# Patient Record
Sex: Male | Born: 1960
Health system: Southern US, Community
[De-identification: ages and names within clinical notes are randomized; demographics above are authoritative.]

## PROBLEM LIST (undated history)

## (undated) ENCOUNTER — Ambulatory Visit: Admission: EM | Payer: Self-pay | Source: Home / Self Care

## (undated) DIAGNOSIS — J40 Bronchitis, not specified as acute or chronic: Secondary | ICD-10-CM

## (undated) HISTORY — PX: HERNIA REPAIR: SHX51

---

## 2006-05-17 ENCOUNTER — Ambulatory Visit (HOSPITAL_COMMUNITY): Admission: RE | Admit: 2006-05-17 | Discharge: 2006-05-17 | Payer: Self-pay | Admitting: Surgery

## 2010-07-06 ENCOUNTER — Emergency Department (HOSPITAL_COMMUNITY)
Admission: EM | Admit: 2010-07-06 | Discharge: 2010-07-06 | Payer: Self-pay | Source: Home / Self Care | Admitting: Family Medicine

## 2010-12-16 NOTE — Op Note (Signed)
NAME:  XAIDEN, FLEIG NO.:  [id number]   MEDICAL RECORD NO.:  [id number]          PATIENT TYPE:  AMB   LOCATION:  DAY                          FACILITY:  [facility]   PHYSICIAN:  Wilmon Arms. Corliss Skains, M.D. DATE OF BIRTH:  [birth year]   DATE OF PROCEDURE:  05/17/2006  DATE OF DISCHARGE:                                 OPERATIVE REPORT   PREOPERATIVE DIAGNOSIS:  Bilateral inguinal hernias.   POSTOPERATIVE DIAGNOSIS:  Bilateral inguinal hernias.   PROCEDURE PERFORMED:  Laparoscopic preperitoneal bilateral inguinal hernia  repair with mesh.   SURGEON:  Wilmon Arms. Tsuei, M.D.   ANESTHESIA:  General endotracheal.   INDICATIONS:  The patient is a 50 year old male who presented with a year of  a left inguinal hernia.  This hernia has become larger and causes discomfort  when he is lifting or with prolonged sitting.  The hernia has been  reducible.  On examination in the office, the patient was also noted have a  very small right inguinal hernia.  The decision was made to repair both  laparoscopically.   DESCRIPTION OF PROCEDURE:  The patient was brought to the operating room,  placed in supine position on the operating room table.  Preoperative  antibiotics were given.  Compression hose were placed on his legs.  After an  adequate level of general anesthesia was obtained, a Foley catheter was  placed under sterile technique.  The patient's abdomen was shaved, prepped  with Betadine and draped in sterile fashion.  A time-out was taken assure  proper patient and proper procedure.  After infiltrating with 0.25%  Marcaine, a 1 cm transverse incision was made below the level of the  umbilicus just to the right of midline.  Dissection was carried down to the  anterior rectus sheath which was opened transversely.  The rectus muscle was  retracted laterally and a Kelly clamp was used to open the retromuscular  space.  The dissection balloon was then inserted into this  retromastoid  space and advanced down to the symphysis pubis.  The inner trocar was  removed and the laparoscope inserted into the balloon trocar.  The balloon  trocar was then inflated with good bilateral inflation.  It was held in  place for 5 minutes for good hemostasis.  The balloon was then deflated and  removed.  The working trocar was then inserted into the preperitoneal space  and the inner balloon was inflated.  Pneumoperitoneum was then obtained by  insufflating CO2 maintaining maximal pressure of 15 mmHg.  The laparoscope  was inserted.  The preperitoneal space was examined.  There was good  dissection with the balloon.  The inferior epigastric vessels were seen  anteriorly on the posterior surface of the rectus muscles.  Two 5 mL ports  were placed in the lower midline.  We began working on the patient's left  sided hernia as this was larger.  A fairly large indirect hernia was reduced  with blunt dissection.  The preperitoneal space was opened all the way out  to the anterior superior iliac spine.  We circumferentially dissected around  the spermatic cord.  No direct defect was noted.  The hernia sac was then  completely reduced.  The Cooper's ligament and pubic bones were also  dissected so they were clearly visualized.  Once our dissection was complete  on the left side, we moved to the other side of the table.  We then worked  on the patient's right side.  This preperitoneal space was opened widely.  A  fairly small indirect hernia sac was reduced off of the spermatic cord.  We  circumferentially dissected around the spermatic cord.  No direct defect was  noted.  We then took a 6 inch x 6 inch piece of ULTRAPRO mesh and cut it in  half.  A 3 inch x 6 inch piece was placed in the preperitoneal space on the  patient's right.  This was opened and secured beginning at the pubic  symphysis with a ProTack device.  A total of 4 tacks were placed anteriorly  to secure the mesh.   This covered the direct and indirect spaces.  The  bottom edge of the mesh was tucked underneath the edge of the peritoneum.  We then moved back to the other side of the table.  The other 3 inch x 6  inch piece of ULTRAPRO mesh was opened in the left preperitoneal space.  This was also secured with 4 ProTacks in similar fashion.  The mesh sat very  easily without any tension.  Pneumoperitoneum was then slowly released and  the peritoneal contents came to rest on the mesh.  Trocars were removed.  The fascia of the anterior rectus sheath was closed with 0 Vicryl.  The 4-0  Monocryl was used to close the skin.  Steri-Strips and clean dressings were  applied.  The Foley catheter was removed.  The patient was extubated and  brought to recovery stable condition.  All sponge, instrument and needle  counts were correct.      Wilmon Arms. Tsuei, M.D.  Electronically Signed     MKT/MEDQ  D:  05/17/2006  T:  05/18/2006  Job:  [id number]

## 2013-02-06 ENCOUNTER — Emergency Department (HOSPITAL_COMMUNITY)
Admission: EM | Admit: 2013-02-06 | Discharge: 2013-02-06 | Disposition: A | Discharge status: Home / Self Care | Payer: 59 | Attending: Emergency Medicine | Admitting: Emergency Medicine

## 2013-02-06 ENCOUNTER — Encounter (HOSPITAL_COMMUNITY): Payer: Self-pay

## 2013-02-06 DIAGNOSIS — Y939 Activity, unspecified: Secondary | ICD-10-CM | POA: Insufficient documentation

## 2013-02-06 DIAGNOSIS — T161XXA Foreign body in right ear, initial encounter: Secondary | ICD-10-CM

## 2013-02-06 DIAGNOSIS — Y929 Unspecified place or not applicable: Secondary | ICD-10-CM | POA: Insufficient documentation

## 2013-02-06 DIAGNOSIS — T169XXA Foreign body in ear, unspecified ear, initial encounter: Secondary | ICD-10-CM | POA: Insufficient documentation

## 2013-02-06 DIAGNOSIS — IMO0002 Reserved for concepts with insufficient information to code with codable children: Secondary | ICD-10-CM | POA: Insufficient documentation

## 2013-02-06 NOTE — ED Notes (Signed)
Pt thinks he has a bug in his ear

## 2013-02-06 NOTE — ED Provider Notes (Signed)
   History    This chart was scribed for non-physician practitioner Roxy Horseman PA-C, working with Nelia Shi, MD by Donne Anon, ED Scribe. This patient was seen in room WTR4/WLPT4 and the patient's care was started at 2321.  CSN: [id number] Arrival date & time 02/06/13  2307  First MD Initiated Contact with Patient 02/06/13 2321     Chief Complaint  Patient presents with  . Foreign Body in Ear    The history is provided by the patient and the EMS personnel. No language interpreter was used.   HPI Comments: Walter Adams is a 52 y.o. male brought in by ambulance, who presents to the Emergency Department complaining of an insect in his right ear. He states it flew into his ear and he could hear it buzzing. He reports it feels better now that the nurse removed the insect. He denies any other pain at this time.   History reviewed. No pertinent past medical history. History reviewed. No pertinent past surgical history. History reviewed. No pertinent family history.  History  Substance Use Topics  . Smoking status: Not on file  . Smokeless tobacco: Not on file  . Alcohol Use: No    Review of Systems A complete 10 system review of systems was obtained and all systems are negative except as noted in the HPI and PMH.   Allergies  Review of patient's allergies indicates no known allergies.  Home Medications  No current outpatient prescriptions on file.  BP 124/76  Pulse 101  Temp(Src) 98.1 F (36.7 C) (Oral)  Resp 20  SpO2 100%  Physical Exam  Nursing note and vitals reviewed. Constitutional: He appears well-developed and well-nourished. No distress.  HENT:  Head: Normocephalic and atraumatic.  Right Ear: Tympanic membrane and ear canal normal.  Left Ear: Tympanic membrane and ear canal normal.  No signs of infection.   Eyes: Conjunctivae are normal.  Neck: Neck supple. No tracheal deviation present.  Cardiovascular: Normal rate.   Pulmonary/Chest: Effort  normal. No respiratory distress.  Musculoskeletal: Normal range of motion.  Neurological: He is alert.  Skin: Skin is warm and dry.  Psychiatric: He has a normal mood and affect. His behavior is normal.    ED Course  Procedures (including critical care time) DIAGNOSTIC STUDIES: Oxygen Saturation is 100% on RA, normal by my interpretation.    COORDINATION OF CARE: 11:27 PM Discussed treatment plan with pt at bedside and pt agreed to plan.    Labs Reviewed - No data to display No results found. 1. Foreign body in ear, right, initial encounter     MDM  Patient with bug in the ear. Insect was removed by a triage nurse successfully. Patient is stable and ready for discharge.  I personally performed the services described in this documentation, which was scribed in my presence. The recorded information has been reviewed and is accurate.    Roxy Horseman, PA-C 02/07/13 0000

## 2013-02-07 NOTE — ED Provider Notes (Signed)
Medical screening examination/treatment/procedure(s) were performed by non-physician practitioner and as supervising physician I was immediately available for consultation/collaboration.  Lakresha Stifter M Takesha Steger, MD 02/07/13 0736 

## 2013-04-13 ENCOUNTER — Emergency Department (HOSPITAL_COMMUNITY)
Admission: EM | Admit: 2013-04-13 | Discharge: 2013-04-13 | Disposition: A | Discharge status: Home / Self Care | Payer: 59 | Source: Home / Self Care

## 2013-04-13 ENCOUNTER — Encounter (HOSPITAL_COMMUNITY): Payer: Self-pay | Admitting: *Deleted

## 2013-04-13 DIAGNOSIS — L02612 Cutaneous abscess of left foot: Secondary | ICD-10-CM

## 2013-04-13 DIAGNOSIS — L02619 Cutaneous abscess of unspecified foot: Secondary | ICD-10-CM

## 2013-04-13 MED ORDER — DOXYCYCLINE HYCLATE 100 MG PO TABS: 100.0000 mg | ORAL_TABLET | Freq: Once | ORAL | Status: AC

## 2013-04-13 MED ORDER — TRAMADOL HCL 50 MG PO TABS: 50.0000 mg | ORAL_TABLET | Freq: Four times a day (QID) | ORAL | Status: DC | PRN

## 2013-04-13 MED ORDER — DOXYCYCLINE HYCLATE 100 MG PO TABS
ORAL_TABLET | ORAL | Status: AC
  Filled 2013-04-13: qty 1

## 2013-04-13 MED ORDER — DOXYCYCLINE HYCLATE 100 MG PO TABS: 100.0000 mg | ORAL_TABLET | Freq: Two times a day (BID) | ORAL | Status: DC

## 2013-04-13 MED ORDER — CLINDAMYCIN HCL 300 MG PO CAPS: 300.0000 mg | ORAL_CAPSULE | Freq: Four times a day (QID) | ORAL | Status: DC

## 2013-04-13 MED ORDER — DOXYCYCLINE HYCLATE 100 MG PO TABS
100.0000 mg | ORAL_TABLET | Freq: Once | ORAL | Status: AC
  Administered 2013-04-13: 100 mg via ORAL

## 2013-04-13 NOTE — ED Notes (Signed)
Assessment per D. Mabe, NP 

## 2013-04-13 NOTE — ED Provider Notes (Signed)
CSN: [id number]     Arrival date & time 04/13/13  0901 History   First MD Initiated Contact with Patient 04/13/13 [phone]     Chief Complaint  Patient presents with  . Foot Swelling   (Consider location/radiation/quality/duration/timing/severity/associated sxs/prior Treatment) HPI Comments: 52 year old male complaining of callus pain to the left foot plantar aspect just proximal to the great toe. This is been present for at least a year. Over the past couple weeks she has developed increased pain with some swelling and surrounding erythema to the same area. He states that he has a relatively comfortable. With his own shoes but the shoes required for his job increases pain and discomfort.   History reviewed. No pertinent past medical history. History reviewed. No pertinent past surgical history. No family history on file. History  Substance Use Topics  . Smoking status: Not on file  . Smokeless tobacco: Not on file  . Alcohol Use: Not on file    Review of Systems  Constitutional: Negative.   Respiratory: Negative.   Gastrointestinal: Negative.   Genitourinary: Negative.   Musculoskeletal:       As per HPI  Skin:       Callus formation to the plantar MTP and surrounding dermis. There is minor erythema over the dorsum of the toe as well  Neurological: Negative for dizziness, weakness, numbness and headaches.    Allergies  Review of patient's allergies indicates no known allergies.  Home Medications   Current Outpatient Rx  Name  Route  Sig  Dispense  Refill  . clindamycin (CLEOCIN) 300 MG capsule   Oral   Take 1 capsule (300 mg total) by mouth 4 (four) times daily. X 10 days   40 capsule   0   . traMADol (ULTRAM) 50 MG tablet   Oral   Take 1 tablet (50 mg total) by mouth every 6 (six) hours as needed for pain.   15 tablet   0    BP 113/74  Pulse 84  Temp(Src) 98 F (36.7 C) (Oral)  Resp 19  SpO2 98% Physical Exam  Nursing note and vitals  reviewed. Constitutional: He is oriented to person, place, and time. He appears well-developed and well-nourished. No distress.  Neck: Neck supple.  Cardiovascular: Normal rate and normal heart sounds.   Pulmonary/Chest: Effort normal and breath sounds normal.  Musculoskeletal: He exhibits tenderness.  Left with hallux valgus deformity and a bunion. The primary site of pain is over a thickened dermis 4/callus formation with underlying fluctuance. Mild erythema along the great toe. The cutaneous area on the plantar aspect is quite tender.  Neurological: He is alert and oriented to person, place, and time.  Skin: Skin is warm and dry. There is erythema.  Psychiatric: He has a normal mood and affect.    ED Course  INCISION AND DRAINAGE Date/Time: 04/13/2013 9:37 AM Performed by: Phineas Real, Tyrica Afzal Authorized by: Clementeen Graham, S Risks and benefits: risks, benefits and alternatives were discussed Consent given by: patient Patient understanding: patient states understanding of the procedure being performed Required items: required blood products, implants, devices, and special equipment available Patient identity confirmed: verbally with patient Type: abscess Body area: lower extremity Location details: left foot Anesthesia: local infiltration Local anesthetic: lidocaine 2% with epinephrine Anesthetic total: 2.5 ml Scalpel size: 11 Incision type: single straight Complexity: simple Drainage: purulent Drainage amount: moderate Wound treatment: wound left open   (including critical care time) Labs Review Labs Reviewed  CULTURE, ROUTINE-ABSCESS   Imaging Review No  results found.  MDM   1. Cutaneous abscess of foot, left      I and D. of the fluctuant/cutaneous abscess of the left foot plantar aspect. Prescription for Keflex 500 mg 4 times a day for 10 days Administer doxycycline 100 mg by mouth now Ultram. Milligrams every 4 hours when necessary pain Go home and soak foot in warm  salt water and it is 3 times a day if possible for the next couple days. Will need to followup with podiatrist.      Hayden Rasmussen, NP 04/13/13 1001

## 2013-04-14 NOTE — ED Provider Notes (Signed)
Medical screening examination/treatment/procedure(s) were performed by a resident physician or non-physician practitioner and as the supervising physician I was immediately available for consultation/collaboration.  Evan Corey, MD    Evan S Corey, MD 04/14/13 0756 

## 2013-04-16 LAB — CULTURE, ROUTINE-ABSCESS

## 2014-01-11 ENCOUNTER — Emergency Department (INDEPENDENT_AMBULATORY_CARE_PROVIDER_SITE_OTHER): Payer: 59

## 2014-01-11 ENCOUNTER — Emergency Department (HOSPITAL_COMMUNITY)
Admission: EM | Admit: 2014-01-11 | Discharge: 2014-01-11 | Disposition: A | Discharge status: Home / Self Care | Payer: 59 | Source: Home / Self Care | Attending: Emergency Medicine | Admitting: Emergency Medicine

## 2014-01-11 ENCOUNTER — Encounter (HOSPITAL_COMMUNITY): Payer: Self-pay | Admitting: Emergency Medicine

## 2014-01-11 DIAGNOSIS — M79609 Pain in unspecified limb: Secondary | ICD-10-CM

## 2014-01-11 DIAGNOSIS — M79606 Pain in leg, unspecified: Secondary | ICD-10-CM

## 2014-01-11 LAB — CBC WITH DIFFERENTIAL/PLATELET
BASOS ABS: 0 10*3/uL (ref 0.0–0.1)
BASOS PCT: 0 % (ref 0–1)
EOS PCT: 1 % (ref 0–5)
Eosinophils Absolute: 0.1 10*3/uL (ref 0.0–0.7)
HEMATOCRIT: 42.7 % (ref 39.0–52.0)
HEMOGLOBIN: 14.7 g/dL (ref 13.0–17.0)
LYMPHS PCT: 43 % (ref 12–46)
Lymphs Abs: 2.7 10*3/uL (ref 0.7–4.0)
MCH: 34.1 pg — ABNORMAL HIGH (ref 26.0–34.0)
MCHC: 34.4 g/dL (ref 30.0–36.0)
MCV: 99.1 fL (ref 78.0–100.0)
MONO ABS: 0.5 10*3/uL (ref 0.1–1.0)
MONOS PCT: 8 % (ref 3–12)
NEUTROS ABS: 2.9 10*3/uL (ref 1.7–7.7)
Neutrophils Relative %: 48 % (ref 43–77)
Platelets: 245 10*3/uL (ref 150–400)
RBC: 4.31 MIL/uL (ref 4.22–5.81)
RDW: 13.8 % (ref 11.5–15.5)
WBC: 6.2 10*3/uL (ref 4.0–10.5)

## 2014-01-11 LAB — D-DIMER, QUANTITATIVE: D-Dimer, Quant: <0.27 ug/mL-FEU (ref 0.00–0.48)

## 2014-01-11 MED ORDER — ETODOLAC 500 MG PO TABS: 500.0000 mg | ORAL_TABLET | Freq: Two times a day (BID) | ORAL | Status: DC

## 2014-01-11 NOTE — ED Provider Notes (Signed)
CSN: [id number]     Arrival date & time 01/11/14  0914 History   First MD Initiated Contact with Patient 01/11/14 [phone]     Chief Complaint  Patient presents with  . Leg Pain   (Consider location/radiation/quality/duration/timing/severity/associated sxs/prior Treatment) HPI Comments: 53 year old male presents complaining of left leg pain. This started on Monday while he was at work. Had some mild pain in the lower left shin when he woke up, this got worse throughout the day. The leg was very swollen earlier in the week. Now the swelling and pain are getting slightly better, he thinks this is due to his new shoes. Now he only has pain with walking or with plantar flexing the left ankle. There is pain is also tender to palpation. He denies any known injury. No history of DVT or PE. No numbness in the foot. No systemic symptoms.  Patient is a 53 y.o. male presenting with leg pain.  Leg Pain   History reviewed. No pertinent past medical history. History reviewed. No pertinent past surgical history. History reviewed. No pertinent family history. History  Substance Use Topics  . Smoking status: Not on file  . Smokeless tobacco: Not on file  . Alcohol Use: Not on file    Review of Systems  Cardiovascular: Positive for leg swelling.  Musculoskeletal:       See history of present illness  All other systems reviewed and are negative.   Allergies  Review of patient's allergies indicates no known allergies.  Home Medications   Prior to Admission medications   Medication Sig Start Date End Date Taking? Authorizing Provider  clindamycin (CLEOCIN) 300 MG capsule Take 1 capsule (300 mg total) by mouth 4 (four) times daily. X 10 days 04/13/13   Hayden Rasmussenavid Mabe, NP  etodolac (LODINE) 500 MG tablet Take 1 tablet (500 mg total) by mouth 2 (two) times daily. 01/11/14   Graylon GoodZachary H Shanquita Ronning, PA-C  traMADol (ULTRAM) 50 MG tablet Take 1 tablet (50 mg total) by mouth every 6 (six) hours as needed for pain. 04/13/13    Hayden Rasmussenavid Mabe, NP   BP 114/79  Pulse 101  Temp(Src) 97.9 F (36.6 C) (Oral)  Resp 20  SpO2 96% Physical Exam  Nursing note and vitals reviewed. Constitutional: He is oriented to person, place, and time. He appears well-developed and well-nourished. No distress.  HENT:  Head: Normocephalic.  Cardiovascular: Normal rate, regular rhythm and normal heart sounds.   Pulmonary/Chest: Effort normal and breath sounds normal. No respiratory distress.  Musculoskeletal:       Left lower leg: He exhibits tenderness and bony tenderness.       Legs: Neurological: He is alert and oriented to person, place, and time. Coordination normal.  Skin: Skin is warm and dry. No rash noted. He is not diaphoretic.  Psychiatric: He has a normal mood and affect. Judgment normal.    ED Course  Procedures (including critical care time) Labs Review Labs Reviewed  CBC WITH DIFFERENTIAL - Abnormal; Notable for the following:    MCH 34.1 (*)    All other components within normal limits  D-DIMER, QUANTITATIVE    Imaging Review Dg Tibia/fibula Left  01/11/2014   CLINICAL DATA:  Anterior left lower leg pain.  EXAM: LEFT TIBIA AND FIBULA - 2 VIEW  COMPARISON:  None.  FINDINGS: There is no evidence of fracture or other focal bone lesions. Soft tissues are unremarkable.  IMPRESSION: Negative.   Electronically Signed   By: Charlett NoseKevin  Dover M.D.  On: 01/11/2014 10:55     MDM   1. Leg pain    XR, labs normal.  Treat with NSAID.  F/U PRN in a week if not improving   Meds ordered this encounter  Medications  . etodolac (LODINE) 500 MG tablet    Sig: Take 1 tablet (500 mg total) by mouth 2 (two) times daily.    Dispense:  30 tablet    Refill:  1    Order Specific Question:  Supervising Provider    Answer:  Lorenz CoasterKELLER, DAVID C [[id number]]      Graylon GoodZachary H Cheri Ayotte, PA-C 01/11/14 1149

## 2014-01-11 NOTE — ED Provider Notes (Signed)
Medical screening examination/treatment/procedure(s) were performed by non-physician practitioner and as supervising physician I was immediately available for consultation/collaboration.  Leslee Homeavid Donta Mcinroy, M.D.  Reuben Likesavid C Jody Aguinaga, MD 01/11/14 [phone]

## 2014-01-11 NOTE — Discharge Instructions (Signed)
Musculoskeletal Pain °Musculoskeletal pain is muscle and boney aches and pains. These pains can occur in any part of the body. Your caregiver may treat you without knowing the cause of the pain. They may treat you if blood or urine tests, X-rays, and other tests were normal.  °CAUSES °There is often not a definite cause or reason for these pains. These pains may be caused by a type of germ (virus). The discomfort may also come from overuse. Overuse includes working out too hard when your body is not fit. Boney aches also come from weather changes. Bone is sensitive to atmospheric pressure changes. °HOME CARE INSTRUCTIONS  °· Ask when your test results will be ready. Make sure you get your test results. °· Only take over-the-counter or prescription medicines for pain, discomfort, or fever as directed by your caregiver. If you were given medications for your condition, do not drive, operate machinery or power tools, or sign legal documents for 24 hours. Do not drink alcohol. Do not take sleeping pills or other medications that may interfere with treatment. °· Continue all activities unless the activities cause more pain. When the pain lessens, slowly resume normal activities. Gradually increase the intensity and duration of the activities or exercise. °· During periods of severe pain, bed rest may be helpful. Lay or sit in any position that is comfortable. °· Putting ice on the injured area. °· Put ice in a bag. °· Place a towel between your skin and the bag. °· Leave the ice on for 15 to 20 minutes, 3 to 4 times a day. °· Follow up with your caregiver for continued problems and no reason can be found for the pain. If the pain becomes worse or does not go away, it may be necessary to repeat tests or do additional testing. Your caregiver may need to look further for a possible cause. °SEEK IMMEDIATE MEDICAL CARE IF: °· You have pain that is getting worse and is not relieved by medications. °· You develop chest pain  that is associated with shortness or breath, sweating, feeling sick to your stomach (nauseous), or throw up (vomit). °· Your pain becomes localized to the abdomen. °· You develop any new symptoms that seem different or that concern you. °MAKE SURE YOU:  °· Understand these instructions. °· Will watch your condition. °· Will get help right away if you are not doing well or get worse. °Document Released: 07/17/2005 Document Revised: 10/09/2011 Document Reviewed: 03/21/2013 °[employer]® Patient Information ©2014 [employer]. ° °

## 2014-01-11 NOTE — ED Notes (Signed)
C/o left leg pain Denies any injury Leg has been swollen since Monday States the pain is a throbbing pulse pain when he walks or flexes toes Did wrap an ace bandage and brought new shoes as tx

## 2014-06-14 ENCOUNTER — Emergency Department (INDEPENDENT_AMBULATORY_CARE_PROVIDER_SITE_OTHER)
Admission: EM | Admit: 2014-06-14 | Discharge: 2014-06-14 | Disposition: A | Discharge status: Home / Self Care | Payer: 59 | Source: Home / Self Care | Attending: Emergency Medicine | Admitting: Emergency Medicine

## 2014-06-14 ENCOUNTER — Encounter (HOSPITAL_COMMUNITY): Payer: Self-pay | Admitting: Emergency Medicine

## 2014-06-14 DIAGNOSIS — H109 Unspecified conjunctivitis: Secondary | ICD-10-CM

## 2014-06-14 MED ORDER — TETRACAINE HCL 0.5 % OP SOLN
OPHTHALMIC | Status: AC
  Filled 2014-06-14: qty 2

## 2014-06-14 MED ORDER — POLYMYXIN B-TRIMETHOPRIM 10000-0.1 UNIT/ML-% OP SOLN: 1.0000 [drp] | OPHTHALMIC | Status: DC

## 2014-06-14 MED ORDER — EYE WASH OPHTH SOLN
OPHTHALMIC | Status: AC
  Filled 2014-06-14: qty 118

## 2014-06-14 NOTE — ED Provider Notes (Signed)
CSN: [id number]     Arrival date & time 06/14/14  0935 History   First MD Initiated Contact with Patient 06/14/14 [phone]     Chief Complaint  Patient presents with  . Eye Problem   (Consider location/radiation/quality/duration/timing/severity/associated sxs/prior Treatment) HPI Comments: After work yesterday felt irritation to the OS. Has been draining and red. Mildly sore and tingling. [facility] [employer] [facility]. Denies trauma   History reviewed. No pertinent past medical history. History reviewed. No pertinent past surgical history. No family history on file. History  Substance Use Topics  . Smoking status: Current Every Day Smoker  . Smokeless tobacco: Not on file  . Alcohol Use: No    Review of Systems  Constitutional: Negative.   HENT: Negative.   Eyes: Positive for discharge and redness. Negative for visual disturbance.  All other systems reviewed and are negative.   Allergies  Review of patient's allergies indicates no known allergies.  Home Medications   Prior to Admission medications   Medication Sig Start Date End Date Taking? Authorizing Provider  Polyethyl Glycol-Propyl Glycol (SYSTANE OP) Apply to eye.   Yes Historical Provider, MD  clindamycin (CLEOCIN) 300 MG capsule Take 1 capsule (300 mg total) by mouth 4 (four) times daily. X 10 days 04/13/13   Hayden Rasmussenavid Shandiin Eisenbeis, NP  etodolac (LODINE) 500 MG tablet Take 1 tablet (500 mg total) by mouth 2 (two) times daily. 01/11/14   Graylon GoodZachary H Baker, PA-C  traMADol (ULTRAM) 50 MG tablet Take 1 tablet (50 mg total) by mouth every 6 (six) hours as needed for pain. 04/13/13   Hayden Rasmussenavid Anikah Hogge, NP  trimethoprim-polymyxin b (POLYTRIM) ophthalmic solution Place 1 drop into the left eye every 4 (four) hours. 06/14/14   Hayden Rasmussenavid Saraiya Kozma, NP   BP 113/76 mmHg  Pulse 90  Temp(Src) 97.7 F (36.5 C) (Oral)  SpO2 100% Physical Exam  Constitutional: He is oriented to person, place, and time. He appears well-developed and well-nourished. No distress.  Eyes: EOM are  normal. Pupils are equal, round, and reactive to light.  Conjunctiva and sclera injected with swelling. No purulence. Anterior chamber clear.  after administration of tetracaine eye drop the eye was inspected with nl light and magnification. Fluoro dye inserted and re0examined with blue light. No abrasions or FB's seen. Eye irrigated with eye wash til clear.  Pulmonary/Chest: Effort normal.  Neurological: He is alert and oriented to person, place, and time.  Skin: Skin is warm and dry.  Nursing note and vitals reviewed.   ED Course  Procedures (including critical care time) Labs Review Labs Reviewed - No data to display  Imaging Review No results found.   MDM   1. Conjunctivitis of left eye    Eye exam as above polytrim eye drops zaditor eye drops Warm compresses F/u with on call opthal as above       Hayden Rasmussenavid Rianne Degraaf, NP 06/14/14 1022

## 2014-06-14 NOTE — ED Notes (Signed)
Instructed patient to get going on eye medicine right away

## 2014-06-14 NOTE — ED Notes (Signed)
Left eye pain, watery (tearing). Eye is red, lids swollen, and feels like something in eye.  Has used systane eye drops in eye.  Denies any vision changes

## 2014-06-14 NOTE — ED Notes (Signed)
Provided graham crackers and sprite 

## 2014-06-14 NOTE — Discharge Instructions (Signed)
Conjunctivitis Obtain a bottle of Zaditor eye drops at pharmacy and place one drop in left eye twice a day for redness and swelling. Conjunctivitis is commonly called "pink eye." Conjunctivitis can be caused by bacterial or viral infection, allergies, or injuries. There is usually redness of the lining of the eye, itching, discomfort, and sometimes discharge. There may be deposits of matter along the eyelids. A viral infection usually causes a watery discharge, while a bacterial infection causes a yellowish, thick discharge. Pink eye is very contagious and spreads by direct contact. You may be given antibiotic eyedrops as part of your treatment. Before using your eye medicine, remove all drainage from the eye by washing gently with warm water and cotton balls. Continue to use the medication until you have awakened 2 mornings in a row without discharge from the eye. Do not rub your eye. This increases the irritation and helps spread infection. Use separate towels from other household members. Wash your hands with soap and water before and after touching your eyes. Use cold compresses to reduce pain and sunglasses to relieve irritation from light. Do not wear contact lenses or wear eye makeup until the infection is gone. SEEK MEDICAL CARE IF:   Your symptoms are not better after 3 days of treatment.  You have increased pain or trouble seeing.  The outer eyelids become very red or swollen. Document Released: 08/24/2004 Document Revised: 10/09/2011 Document Reviewed: 07/17/2005 [facility] Patient Information 2015 [place]. This information is not intended to replace advice given to you by your health care provider. Make sure you discuss any questions you have with your health care provider.

## 2015-06-14 ENCOUNTER — Ambulatory Visit: Payer: 59 | Admitting: Podiatry

## 2015-06-21 ENCOUNTER — Ambulatory Visit: Payer: 59 | Admitting: Podiatry

## 2016-01-17 DIAGNOSIS — M2042 Other hammer toe(s) (acquired), left foot: Secondary | ICD-10-CM | POA: Diagnosis not present

## 2016-01-17 DIAGNOSIS — M2012 Hallux valgus (acquired), left foot: Secondary | ICD-10-CM | POA: Diagnosis not present

## 2016-03-14 ENCOUNTER — Encounter (HOSPITAL_COMMUNITY): Payer: Self-pay

## 2016-03-14 ENCOUNTER — Emergency Department (HOSPITAL_COMMUNITY): Payer: 59

## 2016-03-14 ENCOUNTER — Emergency Department (HOSPITAL_COMMUNITY)
Admission: EM | Admit: 2016-03-14 | Discharge: 2016-03-14 | Disposition: A | Discharge status: Home / Self Care | Payer: 59 | Attending: Emergency Medicine | Admitting: Emergency Medicine

## 2016-03-14 DIAGNOSIS — S3992XA Unspecified injury of lower back, initial encounter: Secondary | ICD-10-CM | POA: Diagnosis not present

## 2016-03-14 DIAGNOSIS — R200 Anesthesia of skin: Secondary | ICD-10-CM | POA: Diagnosis not present

## 2016-03-14 DIAGNOSIS — M5440 Lumbago with sciatica, unspecified side: Secondary | ICD-10-CM | POA: Diagnosis not present

## 2016-03-14 DIAGNOSIS — M545 Low back pain: Secondary | ICD-10-CM

## 2016-03-14 DIAGNOSIS — F17200 Nicotine dependence, unspecified, uncomplicated: Secondary | ICD-10-CM | POA: Diagnosis not present

## 2016-03-14 MED ORDER — CYCLOBENZAPRINE HCL 10 MG PO TABS: 10.0000 mg | ORAL_TABLET | Freq: Two times a day (BID) | ORAL | 0 refills | Status: DC | PRN

## 2016-03-14 NOTE — ED Triage Notes (Addendum)
Pt reports last week his left leg became numb and he fell. Pt denies hitting his head. Pt denies loc. Pt denies visual changes or disturbances. Pt denies numbness at this time. Pt ambulatory, A+OX4, speaking in complete sentences. Denies pain.

## 2016-03-14 NOTE — Discharge Instructions (Signed)
Take Flexeril as needed for low back muscle spasms. If numbness to left thigh continues follow up with pcp to determine if further imaging is needed. Can follow up with neurologist if you would like.

## 2016-03-14 NOTE — ED Provider Notes (Signed)
[facility] Provider Note   CSN: [id number] Arrival date & time: 03/14/16  1611     History   Chief Complaint Chief Complaint  Patient presents with  . Numbness  . Fall    HPI Walter Adams is a 55 y.o. male.  55 year old African-American male no significant past medical history presents today with numbness to his left lateral thigh approximately one week ago. The problem is chronic  And intermittent. Progression is unchanged. Nothing makes better or worse.   Patient states that approximately one week ago he went to get out of bed and his left lateral thigh was numb. He tried to take a few steps to go to the bathroom and lost his balance and fell. Patient did not hit his head. Denies LOC. The numbness lasted for approximately 30 minutes. He had no problems following the event. He was able to go to work shortly after. Patient states this problem has occurred approximately 3-4 times over the past year. Patient denies any other symptoms. He denies fevers, chills,  headache, visual changes, chest pain, shortness of breath, abdominal pain, nausea vomiting, change in bowel habits, urinary symptoms, speech difficulty, loss of sensations, lightheadedness, dizziness, or syncope.  Patient does endorse intermittent lower back spasms. Denies pain. Denies loss of bowel or bladder and saddle paresthesias.    The history is provided by the patient.    History reviewed. No pertinent past medical history.  There are no active problems to display for this patient.   Past Surgical History:  Procedure Laterality Date  . HERNIA REPAIR      OB History    No data available       Home Medications    Prior to Admission medications   Medication Sig Start Date End Date Taking? Authorizing Provider  clindamycin (CLEOCIN) 300 MG capsule Take 1 capsule (300 mg total) by mouth 4 (four) times daily. X 10 days Patient not taking: Reported on 03/14/2016 04/13/13   Hayden Rasmussen, NP  etodolac  (LODINE) 500 MG tablet Take 1 tablet (500 mg total) by mouth 2 (two) times daily. Patient not taking: Reported on 03/14/2016 01/11/14   Graylon Good, PA-C  traMADol (ULTRAM) 50 MG tablet Take 1 tablet (50 mg total) by mouth every 6 (six) hours as needed for pain. Patient not taking: Reported on 03/14/2016 04/13/13   Hayden Rasmussen, NP  trimethoprim-polymyxin b (POLYTRIM) ophthalmic solution Place 1 drop into the left eye every 4 (four) hours. Patient not taking: Reported on 03/14/2016 06/14/14   Hayden Rasmussen, NP    Family History History reviewed. No pertinent family history.  Social History Social History  Substance Use Topics  . Smoking status: Current Every Day Smoker  . Smokeless tobacco: Never Used  . Alcohol use No     Allergies   Review of patient's allergies indicates no known allergies.   Review of Systems Review of Systems  Constitutional: Negative for chills and fever.  HENT: Negative for congestion, ear pain, rhinorrhea and sore throat.   Eyes: Negative for pain and visual disturbance.  Respiratory: Negative for cough and shortness of breath.   Cardiovascular: Negative for chest pain and palpitations.  Gastrointestinal: Negative for abdominal pain, diarrhea, nausea and vomiting.  Genitourinary: Negative for dysuria, flank pain, frequency, hematuria and urgency.  Musculoskeletal: Negative for arthralgias, back pain, gait problem and neck pain.  Skin: Negative for color change and rash.  Neurological: Negative for dizziness, syncope, weakness, light-headedness and headaches.  All other systems reviewed  and are negative.    Physical Exam Updated Vital Signs BP 111/74 (BP Location: Right Arm)   Pulse 77   Temp 97.8 F (36.6 C) (Oral)   Resp 20   Ht 5\' 6"  (1.676 m)   Wt 73.9 kg   SpO2 100%   BMI 26.31 kg/m   Physical Exam  Constitutional: He appears well-developed and well-nourished.  HENT:  Head: Normocephalic and atraumatic.  Mouth/Throat: Oropharynx is clear  and moist.  Eyes: Conjunctivae and EOM are normal. Pupils are equal, round, and reactive to light.  Neck: Normal range of motion. Neck supple. No thyromegaly present.  Cardiovascular: Normal rate, regular rhythm, normal heart sounds and intact distal pulses.   Pulses:      Dorsalis pedis pulses are 2+ on the right side, and 2+ on the left side.  No carotid bruit appreciated.  Pulmonary/Chest: Effort normal and breath sounds normal.  Abdominal: Soft. Bowel sounds are normal. There is no tenderness. There is no rebound and no guarding.  Musculoskeletal: Normal range of motion. He exhibits no edema.  Negative straight leg test  Lymphadenopathy:    He has no cervical adenopathy.  Neurological: He is alert.  Cranial nerves 2-12 grossly intact. Strength 5/5 in all extremities. Sensation intact in all extremities. Patellar reflexes 2+. Rapid alternating movement normal. Finger to nose coordination normal. No pronator drift. Gait normal.  Skin: Skin is warm and dry.  Psychiatric: He has a normal mood and affect.  Nursing note and vitals reviewed.    ED Treatments / Results  Labs (all labs ordered are listed, but only abnormal results are displayed) Labs Reviewed - No data to display  EKG  EKG Interpretation None       Radiology Dg Lumbar Spine Complete  Result Date: 03/14/2016 CLINICAL DATA:  Initial encounter for Pt reports last week his left leg became numb from his knee to his hip and caused him to fall. Pt denies pain currently. Pt states the numbness in his leg happens intermittently x 2-3 years EXAM: LUMBAR SPINE - COMPLETE 4+ VIEW COMPARISON:  None. FINDINGS: Five lumbar type vertebral bodies. Minimal convex left lumbar spine curvature. Maintenance of vertebral body height and alignment. Degenerate disc disease at the lumbosacral junction. Facet arthropathy at this level. Aortic atherosclerosis. IMPRESSION: Spondylosis, without acute osseous abnormality. Aortic atherosclerosis.  Electronically Signed   By: Jeronimo GreavesKyle  Talbot M.D.   On: 03/14/2016 19:02    Procedures Procedures (including critical care time)  Medications Ordered in ED Medications - No data to display   Initial Impression / Assessment and Plan / ED Course  I have reviewed the triage vital signs and the nursing notes.  Pertinent labs & imaging results that were available during my care of the patient were reviewed by me and considered in my medical decision making (see chart for details).  Clinical Course   Patient presents with left lateral thigh numbness that happened approx  1 week ago for 30 minutes. Patient denies any other symptoms. No neuro deficits on exam. Low suspicion for CVA. Lumbar xray showed spondylosis without acute abnormality. No red flag symptoms for back pain. Gave prescription for flexeril when he has episodes of back spasm. Patient encouraged to follow up with primary care to determine need for further imaging. Patient encouraged to return if he has signs of symptoms of stroke. Patient verbalized understanding. Discussed plan of care with Tatyanna Kirichenko PA-C. Patient in NAD and ready for discharge home. Final Clinical Impressions(s) / ED Diagnoses  Final diagnoses:  Numbness in left leg  Low back pain, unspecified back pain laterality, with sciatica presence unspecified    New Prescriptions Discharge Medication List as of 03/14/2016  7:12 PM    START taking these medications   Details  cyclobenzaprine (FLEXERIL) 10 MG tablet Take 1 tablet (10 mg total) by mouth 2 (two) times daily as needed for muscle spasms., Starting Tue 03/14/2016, Print         Rise MuKenneth T Leaphart, PA-C 03/14/16 [id number]    Benjiman CoreNathan Pickering, MD 03/14/16 [id number]

## 2016-03-14 NOTE — ED Notes (Signed)
Pt able to ambulate independently w/ normal gait to bathroom. Pt denies increased pain or numbness.

## 2016-06-24 ENCOUNTER — Encounter (HOSPITAL_COMMUNITY): Payer: Self-pay | Admitting: *Deleted

## 2016-06-24 ENCOUNTER — Ambulatory Visit (HOSPITAL_COMMUNITY)
Admission: EM | Admit: 2016-06-24 | Discharge: 2016-06-24 | Disposition: A | Discharge status: Home / Self Care | Payer: 59 | Attending: Internal Medicine | Admitting: Internal Medicine

## 2016-06-24 DIAGNOSIS — L84 Corns and callosities: Secondary | ICD-10-CM | POA: Diagnosis not present

## 2016-06-24 DIAGNOSIS — M21612 Bunion of left foot: Secondary | ICD-10-CM

## 2016-06-24 DIAGNOSIS — M79672 Pain in left foot: Secondary | ICD-10-CM

## 2016-06-24 MED ORDER — SULFAMETHOXAZOLE-TRIMETHOPRIM 800-160 MG PO TABS: 1.0000 | ORAL_TABLET | Freq: Two times a day (BID) | ORAL | 0 refills | Status: AC

## 2016-06-24 MED ORDER — NAPROXEN 500 MG PO TABS: 500.0000 mg | ORAL_TABLET | Freq: Two times a day (BID) | ORAL | 0 refills | Status: DC

## 2016-06-24 MED ORDER — HYDROCODONE-ACETAMINOPHEN 5-325 MG PO TABS: 1.0000 | ORAL_TABLET | Freq: Four times a day (QID) | ORAL | 0 refills | Status: DC | PRN

## 2016-06-24 NOTE — Discharge Instructions (Addendum)
Calluses in left foot are deep and inflamed, cannot exclude infection although inflammation and pressure are the most likely.  Prescriptions for trimethoprim/sulfa (antibiotic), naproxen (anti inflammatory), and vicodin (small number, for severest pain) were given.  Note for work x 2 days given, return to work 06/27/2016.  Followup with podiatrist for definitive management.

## 2016-06-24 NOTE — ED Triage Notes (Signed)
Pt  Reports   Pain    In  The  Left  Foot       For  A   Long  Time          Pt    Reports      He  Has  Seen  A  Podiatrist  And  Orthopedist  And  Has  Had  Scrapings  Done   About  6  Months  Ago he  Has  Pain on  Weight  Bearing       And certain posistions

## 2016-06-24 NOTE — ED Provider Notes (Signed)
[facility]    CSN: [id number] Arrival date & time: 06/24/16  1849     History   Chief Complaint Chief Complaint  Patient presents with  . Foot Pain    HPI Walter Adams is a 55 y.o. male. He has bilateral severe bunions, left greater than right, with multiple calluses on the left foot. 3 of these are particularly painful. He most recently had paring of these 5-6 months ago. He also has 3 blisters on the sole of his left foot. Having trouble with working because of pain with standing.    HPI  History reviewed. No pertinent past medical history.   Past Surgical History:  Procedure Laterality Date  . HERNIA REPAIR       Home Medications    Prior to Admission medications   Medication Sig Start Date End Date Taking? Authorizing Provider  cyclobenzaprine (FLEXERIL) 10 MG tablet Take 1 tablet (10 mg total) by mouth 2 (two) times daily as needed for muscle spasms. 03/14/16   Rise MuKenneth T Leaphart, PA-C  HYDROcodone-acetaminophen (NORCO/VICODIN) 5-325 MG tablet Take 1 tablet by mouth 4 (four) times daily as needed. 06/24/16   Eustace MooreLaura W Chimaobi Casebolt, MD  naproxen (NAPROSYN) 500 MG tablet Take 1 tablet (500 mg total) by mouth 2 (two) times daily. 06/24/16   Eustace MooreLaura W Libra Gatz, MD  sulfamethoxazole-trimethoprim (BACTRIM DS,SEPTRA DS) 800-160 MG tablet Take 1 tablet by mouth 2 (two) times daily. 06/24/16 07/01/16  Eustace MooreLaura W Cartha Rotert, MD    Family History History reviewed. No pertinent family history.  Social History Social History  Substance Use Topics  . Smoking status: Current Every Day Smoker  . Smokeless tobacco: Never Used  . Alcohol use No     Allergies   Patient has no known allergies.   Review of Systems Review of Systems  All other systems reviewed and are negative.    Physical Exam Triage Vital Signs ED Triage Vitals  Enc Vitals Group     BP 06/24/16 1909 132/78     Pulse Rate 06/24/16 1909 78     Resp 06/24/16 1909 18     Temp 06/24/16 1909 98.6 F (37  C)     Temp Source 06/24/16 1909 Oral     SpO2 06/24/16 1909 100 %     Weight --      Height --      Pain Score 06/24/16 1910 10   Updated Vital Signs BP 132/78 (BP Location: Right Arm)   Pulse 78   Temp 98.6 F (37 C) (Oral)   Resp 18   SpO2 100%     Physical Exam  Constitutional: He is oriented to person, place, and time. No distress.  Alert, nicely groomed  HENT:  Head: Atraumatic.  Eyes:  Conjugate gaze, no eye redness/drainage  Neck: Neck supple.  Cardiovascular: Normal rate.   Pulmonary/Chest: No respiratory distress.  Lungs clear, symmetric breath sounds  Abdominal: Soft. He exhibits no distension. There is no tenderness. There is no guarding.  Musculoskeletal: Normal range of motion.       Feet:  Neurological: He is alert and oriented to person, place, and time.  Skin: Skin is warm and dry.  No cyanosis Large bunion on the left foot, with multiple calluses on the sole. 3 of these are deep, tender to palpation, and appear inflamed. No fluctuance appreciated, not draining.  Nursing note and vitals reviewed.    UC Treatments / Results   Procedures Procedures (including critical care time) None today  Final Clinical Impressions(s) / UC Diagnoses   Final diagnoses:  Callus of foot  Foot pain, left  Bunion, left foot   Calluses in left foot are deep and inflamed, cannot exclude infection although inflammation and pressure are the most likely.  Prescriptions for trimethoprim/sulfa (antibiotic), naproxen (anti inflammatory), and vicodin (small number, for severest pain) were given.  Note for work x 2 days given, return to work 06/27/2016.  Followup with podiatrist for definitive management.  New Prescriptions Discharge Medication List as of 06/24/2016  7:33 PM    START taking these medications   Details  HYDROcodone-acetaminophen (NORCO/VICODIN) 5-325 MG tablet Take 1 tablet by mouth 4 (four) times daily as needed., Starting Sat 06/24/2016, Print      naproxen (NAPROSYN) 500 MG tablet Take 1 tablet (500 mg total) by mouth 2 (two) times daily., Starting Sat 06/24/2016, Normal    sulfamethoxazole-trimethoprim (BACTRIM DS,SEPTRA DS) 800-160 MG tablet Take 1 tablet by mouth 2 (two) times daily., Starting Sat 06/24/2016, Until Sat 07/01/2016, Normal         Eustace MooreLaura W Laurianne Floresca, MD 06/26/16 1357

## 2016-07-17 ENCOUNTER — Ambulatory Visit (INDEPENDENT_AMBULATORY_CARE_PROVIDER_SITE_OTHER): Payer: Self-pay

## 2016-07-17 ENCOUNTER — Ambulatory Visit (INDEPENDENT_AMBULATORY_CARE_PROVIDER_SITE_OTHER): Payer: 59 | Admitting: Orthopedic Surgery

## 2016-07-17 DIAGNOSIS — M21619 Bunion of unspecified foot: Secondary | ICD-10-CM

## 2016-07-17 DIAGNOSIS — M205X2 Other deformities of toe(s) (acquired), left foot: Secondary | ICD-10-CM

## 2016-07-17 DIAGNOSIS — M79672 Pain in left foot: Secondary | ICD-10-CM

## 2016-07-17 DIAGNOSIS — M79671 Pain in right foot: Secondary | ICD-10-CM

## 2016-07-17 NOTE — Progress Notes (Signed)
Office Visit Note   Patient: Walter Adams           Date of Birth: 02/18/1961           MRN: [id number] Visit Date: 07/17/2016              Requested by: Fleet ContrasEdwin Avbuere, MD [address] [place], [place] [address] PCP: Dorrene GermanEdwin A Avbuere, MD   Assessment & Plan: Visit Diagnoses:  1. Bilateral foot pain   2. Bunion of great toe left   3. Claw toe, acquired, left     Plan: Patient complains of pain activities daily living left worse than right. He states she would like to proceed with bunion surgery. Would plan for an opening wedge osteotomy of the base of the first metatarsal left foot distal soft tissue and ostectomy of the metatarsal head with an Akin osteotomy of the proximal phalanx great toe. Patient also would require a Weil osteotomy for the second and third metatarsals with fixed clawing. Risk and benefits were discussed including infection neurovascular injury persistent pain and need for additional surgery. Patient states he understands and wishes to proceed with surgery in February.  The importance of smoking cessation was discussed for wound healing and bone healing.  Follow-Up Instructions: Return if symptoms worsen or fail to improve.   Orders:  Orders Placed This Encounter  Procedures  . XR Foot Complete Left  . XR Foot Complete Right   No orders of the defined types were placed in this encounter.     Procedures: No procedures performed   Clinical Data: No additional findings.   Subjective: Chief Complaint  Patient presents with  . Left Foot - Pain  . Right Foot - Pain    HPI patient complains of bilateral bunion pain worse than the left and the right pain with shoewear pain with activities of daily living states he sometimes has to use crutches due to his bunion pain.  Review of Systems Patient does smoke.  Objective: Vital Signs: There were no vitals taken for this visit.  Physical Exam on examination patient is alert oriented no adenopathy  well-dressed normal affect, rest or after he does have an antalgic gait he has a strong dorsalis pedis and posterior tibial pulse. Examination of the left foot is overlapping the great toe and second toe with fixed clawing of the second toe. Patient does not have hallux rigidus has good range of motion of the great toe MTP joint. Right foot patient also has good pulses he has overlapping the great toe and second toe with a long second and third metatarsal. Bilaterally patient's second and third metatarsals are tender to palpation. Has callus on the plantar aspect of his feet at both locations.  Ortho Exam  Specialty Comments:  No specialty comments available.  Imaging: Xr Foot Complete Left  Result Date: 07/17/2016 Patient has severe bunion deformity left foot with a long second and third metatarsal with a dislocated MTP joint of the second toe with a non-congruent MTP joint of the great toe  Xr Foot Complete Right  Result Date: 07/17/2016 Two-view radiographs the right foot shows a long second and third metatarsal with no dislocation of the lesser toe MTP joints. Patient has arthritic changes of the great toe MTP joint with severe hallux valgus deformity.    PMFS History: There are no active problems to display for this patient.  No past medical history on file.  No family history on file.  Past Surgical History:  Procedure Laterality Date  . HERNIA REPAIR     Social History   Occupational History  . Not on file.   Social History Main Topics  . Smoking status: Current Every Day Smoker  . Smokeless tobacco: Never Used  . Alcohol use No  . Drug use: No  . Sexual activity: Not on file

## 2016-07-21 ENCOUNTER — Ambulatory Visit (INDEPENDENT_AMBULATORY_CARE_PROVIDER_SITE_OTHER): Payer: 59 | Admitting: Orthopaedic Surgery

## 2016-08-10 ENCOUNTER — Other Ambulatory Visit (INDEPENDENT_AMBULATORY_CARE_PROVIDER_SITE_OTHER): Payer: Self-pay | Admitting: Family

## 2016-09-20 ENCOUNTER — Encounter (HOSPITAL_COMMUNITY)
Admission: RE | Admit: 2016-09-20 | Discharge: 2016-09-20 | Disposition: A | Discharge status: Home / Self Care | Payer: 59 | Source: Ambulatory Visit | Attending: Orthopedic Surgery | Admitting: Orthopedic Surgery

## 2016-09-20 ENCOUNTER — Encounter (HOSPITAL_COMMUNITY): Payer: Self-pay

## 2016-09-20 DIAGNOSIS — M21612 Bunion of left foot: Secondary | ICD-10-CM | POA: Diagnosis not present

## 2016-09-20 DIAGNOSIS — Z01818 Encounter for other preprocedural examination: Secondary | ICD-10-CM | POA: Insufficient documentation

## 2016-09-20 DIAGNOSIS — M205X2 Other deformities of toe(s) (acquired), left foot: Secondary | ICD-10-CM | POA: Diagnosis not present

## 2016-09-20 LAB — CBC
HCT: 40.9 % (ref 39.0–52.0)
Hemoglobin: 14.4 g/dL (ref 13.0–17.0)
MCH: 34.1 pg — ABNORMAL HIGH (ref 26.0–34.0)
MCHC: 35.2 g/dL (ref 30.0–36.0)
MCV: 96.9 fL (ref 78.0–100.0)
PLATELETS: 255 10*3/uL (ref 150–400)
RBC: 4.22 MIL/uL (ref 4.22–5.81)
RDW: 13.3 % (ref 11.5–15.5)
WBC: 8 10*3/uL (ref 4.0–10.5)

## 2016-09-20 MED ORDER — CHLORHEXIDINE GLUCONATE 4 % EX LIQD: 60.0000 mL | Freq: Once | CUTANEOUS | Status: DC

## 2016-09-20 MED ORDER — CEFAZOLIN SODIUM-DEXTROSE 2-4 GM/100ML-% IV SOLN: 2.0000 g | INTRAVENOUS | Status: DC

## 2016-09-20 NOTE — Progress Notes (Signed)
PCP: Dr. Concepcion ElkAvbuere  Cardiologist: pt denies  ECG: pt denies  ECHO: pt denies  Cardiac cath: pt denies  Chest x-ray: pt denies

## 2016-09-20 NOTE — Pre-Procedure Instructions (Signed)
Walter MasterRobert C Mermelstein  09/20/2016      [facility] - [place], [place] - [address] [place] [address] Phone: [phone] Fax: [phone]  [facility] - [place], [place] - [address] AT [facility] [address] [place] [address] Phone: [phone] Fax: [phone]    Your procedure is scheduled on Wed, Feb 28 @ 7:30 AM  [facility]  Call this number if you have problems the morning of surgery:  [phone]   Remember:  Do not eat food or drink liquids after midnight.              No Goody's,BC's,Aleve,Advil,Motrin,Ibuprofen,Aspirin,Fish Oil,or any Herbal Medications.   Do not wear jewelry.  Do not wear lotions, powders,colognes, or deoderant.  Men may shave face and neck.  Do not bring valuables to the hospital.  [facility] is not responsible for any belongings or valuables.  Contacts, dentures or bridgework may not be worn into surgery.  Leave your suitcase in the car.  After surgery it may be brought to your room.  For patients admitted to the hospital, discharge time will be determined by your treatment team.  Patients discharged the day of surgery will not be allowed to drive home.    Special [place] - Preparing for Surgery  Before surgery, you can play an important role.  Because skin is not sterile, your skin needs to be as free of germs as possible.  You can reduce the number of germs on you skin by washing with CHG (chlorahexidine gluconate) soap before surgery.  CHG is an antiseptic cleaner which kills germs and bonds with the skin to continue killing germs even after washing.  Please DO NOT use if you have an allergy to CHG or antibacterial soaps.  If your skin becomes reddened/irritated stop using the CHG and inform your nurse when you arrive at Short Stay.  Do not shave (including legs and  underarms) for at least 48 hours prior to the first CHG shower.  You may shave your face.  Please follow these instructions carefully:   1.  Shower with CHG Soap the night before surgery and the                                morning of Surgery.  2.  If you choose to wash your hair, wash your hair first as usual with your       normal shampoo.  3.  After you shampoo, rinse your hair and body thoroughly to remove the                      Shampoo.  4.  Use CHG as you would any other liquid soap.  You can apply chg directly       to the skin and wash gently with scrungie or a clean washcloth.  5.  Apply the CHG Soap to your body ONLY FROM THE NECK DOWN.        Do not use on open wounds or open sores.  Avoid contact with your eyes,       ears, mouth and genitals (private parts).  Wash genitals (private parts)       with your normal soap.  6.  Wash thoroughly, paying special attention  to the area where your surgery        will be performed.  7.  Thoroughly rinse your body with warm water from the neck down.  8.  DO NOT shower/wash with your normal soap after using and rinsing off       the CHG Soap.  9.  Pat yourself dry with a clean towel.            10.  Wear clean pajamas.            11.  Place clean sheets on your bed the night of your first shower and do not        sleep with pets.  Day of Surgery  Do not apply any lotions/deoderants the morning of surgery.  Please wear clean clothes to the hospital/surgery center.    Please read over the following fact sheets that you were given. Pain Booklet, Coughing and Deep Breathing, MRSA Information and Surgical Site Infection Prevention

## 2016-09-27 ENCOUNTER — Encounter (INDEPENDENT_AMBULATORY_CARE_PROVIDER_SITE_OTHER): Payer: Self-pay | Admitting: Orthopedic Surgery

## 2016-09-27 ENCOUNTER — Encounter (HOSPITAL_COMMUNITY)
Admission: RE | Disposition: A | Discharge status: Home / Self Care | Payer: Self-pay | Source: Ambulatory Visit | Attending: Orthopedic Surgery

## 2016-09-27 ENCOUNTER — Encounter (HOSPITAL_COMMUNITY): Payer: Self-pay

## 2016-09-27 ENCOUNTER — Ambulatory Visit (INDEPENDENT_AMBULATORY_CARE_PROVIDER_SITE_OTHER): Payer: 59 | Admitting: Orthopedic Surgery

## 2016-09-27 ENCOUNTER — Ambulatory Visit (HOSPITAL_COMMUNITY)
Admission: RE | Admit: 2016-09-27 | Discharge: 2016-09-27 | Disposition: A | Discharge status: Home / Self Care | Payer: 59 | Source: Ambulatory Visit | Attending: Orthopedic Surgery | Admitting: Orthopedic Surgery

## 2016-09-27 ENCOUNTER — Ambulatory Visit (HOSPITAL_COMMUNITY): Payer: 59 | Admitting: Anesthesiology

## 2016-09-27 VITALS — Ht 66.0 in | Wt 159.0 lb

## 2016-09-27 DIAGNOSIS — G8918 Other acute postprocedural pain: Secondary | ICD-10-CM | POA: Diagnosis not present

## 2016-09-27 DIAGNOSIS — M21612 Bunion of left foot: Secondary | ICD-10-CM | POA: Diagnosis not present

## 2016-09-27 DIAGNOSIS — Q6689 Other  specified congenital deformities of feet: Secondary | ICD-10-CM | POA: Diagnosis not present

## 2016-09-27 DIAGNOSIS — F17210 Nicotine dependence, cigarettes, uncomplicated: Secondary | ICD-10-CM | POA: Diagnosis not present

## 2016-09-27 DIAGNOSIS — M205X2 Other deformities of toe(s) (acquired), left foot: Secondary | ICD-10-CM | POA: Diagnosis not present

## 2016-09-27 DIAGNOSIS — M21619 Bunion of unspecified foot: Secondary | ICD-10-CM

## 2016-09-27 HISTORY — PX: WEIL OSTEOTOMY: SHX5044

## 2016-09-27 SURGERY — OSTEOTOMY, WEIL
Anesthesia: Regional | Site: Foot | Laterality: Left

## 2016-09-27 MED ORDER — FENTANYL CITRATE (PF) 100 MCG/2ML IJ SOLN
INTRAMUSCULAR | Status: DC | PRN
  Administered 2016-09-27 (×2): 50 ug via INTRAVENOUS

## 2016-09-27 MED ORDER — PROPOFOL 10 MG/ML IV BOLUS
INTRAVENOUS | Status: DC | PRN
  Administered 2016-09-27: 200 mg via INTRAVENOUS

## 2016-09-27 MED ORDER — FENTANYL CITRATE (PF) 100 MCG/2ML IJ SOLN
INTRAMUSCULAR | Status: AC
  Filled 2016-09-27: qty 2

## 2016-09-27 MED ORDER — HYDROCODONE-ACETAMINOPHEN 5-325 MG PO TABS: 1.0000 | ORAL_TABLET | Freq: Four times a day (QID) | ORAL | 0 refills | Status: DC | PRN

## 2016-09-27 MED ORDER — LACTATED RINGERS IV SOLN
INTRAVENOUS | Status: DC | PRN
  Administered 2016-09-27: 11:00:00 via INTRAVENOUS

## 2016-09-27 MED ORDER — BUPIVACAINE-EPINEPHRINE (PF) 0.5% -1:200000 IJ SOLN
INTRAMUSCULAR | Status: DC | PRN
  Administered 2016-09-27: 30 mL via PERINEURAL

## 2016-09-27 MED ORDER — MIDAZOLAM HCL 2 MG/2ML IJ SOLN
INTRAMUSCULAR | Status: AC
  Filled 2016-09-27: qty 2

## 2016-09-27 MED ORDER — 0.9 % SODIUM CHLORIDE (POUR BTL) OPTIME
TOPICAL | Status: DC | PRN
  Administered 2016-09-27: 1000 mL

## 2016-09-27 MED ORDER — LIDOCAINE HCL (CARDIAC) 20 MG/ML IV SOLN
INTRAVENOUS | Status: DC | PRN
  Administered 2016-09-27: 50 mg via INTRATRACHEAL

## 2016-09-27 MED ORDER — FENTANYL CITRATE (PF) 100 MCG/2ML IJ SOLN
INTRAMUSCULAR | Status: AC
Start: 2016-09-27 — End: 2016-09-27
  Filled 2016-09-27: qty 2

## 2016-09-27 MED ORDER — FENTANYL CITRATE (PF) 100 MCG/2ML IJ SOLN
100.0000 ug | Freq: Once | INTRAMUSCULAR | Status: AC
  Administered 2016-09-27: 100 ug via INTRAVENOUS

## 2016-09-27 MED ORDER — MIDAZOLAM HCL 5 MG/5ML IJ SOLN
INTRAMUSCULAR | Status: DC | PRN
Start: 2016-09-27 — End: 2016-09-27
  Administered 2016-09-27: 2 mg via INTRAVENOUS

## 2016-09-27 MED ORDER — CEFAZOLIN SODIUM-DEXTROSE 2-4 GM/100ML-% IV SOLN
INTRAVENOUS | Status: AC
  Filled 2016-09-27: qty 100

## 2016-09-27 MED ORDER — PHENYLEPHRINE 40 MCG/ML (10ML) SYRINGE FOR IV PUSH (FOR BLOOD PRESSURE SUPPORT)
PREFILLED_SYRINGE | INTRAVENOUS | Status: DC | PRN
  Administered 2016-09-27 (×2): 40 ug via INTRAVENOUS
  Administered 2016-09-27: 80 ug via INTRAVENOUS

## 2016-09-27 MED ORDER — HYDROCODONE-ACETAMINOPHEN 5-325 MG PO TABS: 1.0000 | ORAL_TABLET | ORAL | 0 refills | Status: DC | PRN

## 2016-09-27 MED ORDER — MIDAZOLAM HCL 2 MG/2ML IJ SOLN: 0.5000 mg | Freq: Once | INTRAMUSCULAR | Status: DC

## 2016-09-27 MED ORDER — ONDANSETRON HCL 4 MG/2ML IJ SOLN
INTRAMUSCULAR | Status: DC | PRN
  Administered 2016-09-27: 4 mg via INTRAVENOUS

## 2016-09-27 MED ORDER — MIDAZOLAM HCL 2 MG/2ML IJ SOLN
INTRAMUSCULAR | Status: AC
  Administered 2016-09-27: 2 mg
  Filled 2016-09-27: qty 2

## 2016-09-27 MED ORDER — CEFAZOLIN SODIUM-DEXTROSE 2-3 GM-% IV SOLR
2.0000 g | Freq: Once | INTRAVENOUS | Status: AC
  Administered 2016-09-27: 2 g via INTRAVENOUS
  Filled 2016-09-27: qty 50

## 2016-09-27 SURGICAL SUPPLY — 69 items
BIT DRILL 1.7 LOW PROFILE (BIT) ×2 IMPLANT
BLADE AVERAGE 25MMX9MM (BLADE) ×1
BLADE AVERAGE 25X9 (BLADE) ×1 IMPLANT
BLADE MINI RND TIP GREEN BEAV (BLADE) IMPLANT
BLADE OSCILLATING/SAGITTAL (BLADE) ×6
BLADE SW THK.38XMED NAR THN (BLADE) IMPLANT
BNDG CMPR 9X4 STRL LF SNTH (GAUZE/BANDAGES/DRESSINGS) ×1
BNDG COHESIVE 1X5 TAN STRL LF (GAUZE/BANDAGES/DRESSINGS) ×2 IMPLANT
BNDG COHESIVE 4X5 TAN STRL (GAUZE/BANDAGES/DRESSINGS) ×2 IMPLANT
BNDG COHESIVE 6X5 TAN STRL LF (GAUZE/BANDAGES/DRESSINGS) IMPLANT
BNDG ESMARK 4X9 LF (GAUZE/BANDAGES/DRESSINGS) ×3 IMPLANT
BNDG GAUZE ELAST 4 BULKY (GAUZE/BANDAGES/DRESSINGS) ×2 IMPLANT
BNDG GAUZE STRTCH 6 (GAUZE/BANDAGES/DRESSINGS) IMPLANT
BONE CANC CHIPS 20CC PCAN1/4 (Bone Implant) ×3 IMPLANT
CHIPS CANC BONE 20CC PCAN1/4 (Bone Implant) ×1 IMPLANT
CORDS BIPOLAR (ELECTRODE) ×1 IMPLANT
COTTON STERILE ROLL (GAUZE/BANDAGES/DRESSINGS) IMPLANT
COVER SURGICAL LIGHT HANDLE (MISCELLANEOUS) ×6 IMPLANT
CUFF TOURNIQUET SINGLE 18IN (TOURNIQUET CUFF) IMPLANT
CUFF TOURNIQUET SINGLE 24IN (TOURNIQUET CUFF) IMPLANT
DRAPE OEC MINIVIEW 54X84 (DRAPES) IMPLANT
DRAPE U-SHAPE 47X51 STRL (DRAPES) ×3 IMPLANT
DRSG ADAPTIC 3X8 NADH LF (GAUZE/BANDAGES/DRESSINGS) ×2 IMPLANT
DURAPREP 26ML APPLICATOR (WOUND CARE) ×3 IMPLANT
ELECT REM PT RETURN 9FT ADLT (ELECTROSURGICAL) ×3
ELECTRODE REM PT RTRN 9FT ADLT (ELECTROSURGICAL) ×1 IMPLANT
GAUZE SPONGE 4X4 12PLY STRL (GAUZE/BANDAGES/DRESSINGS) IMPLANT
GLOVE BIOGEL PI IND STRL 9 (GLOVE) ×1 IMPLANT
GLOVE BIOGEL PI INDICATOR 9 (GLOVE) ×2
GLOVE INDICATOR 7.5 STRL GRN (GLOVE) ×4 IMPLANT
GLOVE SURG ORTHO 9.0 STRL STRW (GLOVE) ×3 IMPLANT
GLOVE SURG SS PI 6.5 STRL IVOR (GLOVE) ×4 IMPLANT
GOWN STRL REUS W/ TWL XL LVL3 (GOWN DISPOSABLE) ×2 IMPLANT
GOWN STRL REUS W/TWL XL LVL3 (GOWN DISPOSABLE) ×6
GRAFT BNE CANC CHIPS 1-8 20CC (Bone Implant) IMPLANT
K-WIRE 1.6MMX 102MM ×2 IMPLANT
K-WIRE BB-TAK (WIRE) ×3
KIT BASIN OR (CUSTOM PROCEDURE TRAY) ×3 IMPLANT
KIT ROOM TURNOVER OR (KITS) ×3 IMPLANT
KWIRE BB-TAK (WIRE) IMPLANT
MANIFOLD NEPTUNE II (INSTRUMENTS) ×3 IMPLANT
NDL HYPO 25GX1X1/2 BEV (NEEDLE) IMPLANT
NEEDLE HYPO 25GX1X1/2 BEV (NEEDLE) IMPLANT
NS IRRIG 1000ML POUR BTL (IV SOLUTION) ×3 IMPLANT
PACK ORTHO EXTREMITY (CUSTOM PROCEDURE TRAY) ×3 IMPLANT
PAD ARMBOARD 7.5X6 YLW CONV (MISCELLANEOUS) ×6 IMPLANT
PAD CAST 4YDX4 CTTN HI CHSV (CAST SUPPLIES) IMPLANT
PADDING CAST COTTON 4X4 STRL (CAST SUPPLIES)
PIN CAPS ORTHO GREEN .062 (PIN) ×2 IMPLANT
PLATE 5MM POW LEFT (Plate) ×2 IMPLANT
SCREW CORTICAL 2.3X18 (Screw) ×6 IMPLANT
SCREW CORTICAL 2.3X20 (Screw) ×2 IMPLANT
SCREW QUICK FIX 2X12 (Screw) ×2 IMPLANT
SCREW QUICKFIX 2X11 (Screw) ×2 IMPLANT
SPECIMEN JAR SMALL (MISCELLANEOUS) ×3 IMPLANT
SUCTION FRAZIER HANDLE 10FR (MISCELLANEOUS)
SUCTION TUBE FRAZIER 10FR DISP (MISCELLANEOUS) IMPLANT
SUT ETHILON 2 0 FS 18 (SUTURE) IMPLANT
SUT ETHILON 2 0 PSLX (SUTURE) ×4 IMPLANT
SUT VIC AB 2-0 CT1 27 (SUTURE) ×3
SUT VIC AB 2-0 CT1 TAPERPNT 27 (SUTURE) IMPLANT
SUT VIC AB 2-0 FS1 27 (SUTURE) IMPLANT
SYR CONTROL 10ML LL (SYRINGE) IMPLANT
TOWEL OR 17X24 6PK STRL BLUE (TOWEL DISPOSABLE) ×3 IMPLANT
TOWEL OR 17X26 10 PK STRL BLUE (TOWEL DISPOSABLE) ×3 IMPLANT
TUBE CONNECTING 12'X1/4 (SUCTIONS)
TUBE CONNECTING 12X1/4 (SUCTIONS) IMPLANT
WATER STERILE IRR 1000ML POUR (IV SOLUTION) ×3 IMPLANT
WIRE K 1.6MM 144256 (MISCELLANEOUS) ×2 IMPLANT

## 2016-09-27 NOTE — Progress Notes (Signed)
   Office Visit Note   Patient: Walter Adams           Date of Birth: 05/18/1961           MRN: [id number] Visit Date: 09/27/2016              Requested by: Fleet ContrasEdwin Avbuere, MD [address] [place], [place] [id number] PCP: Dorrene GermanEdwin A Avbuere, MD  Chief Complaint  Patient presents with  . Left Foot - Wound Check    Opening wedge osteotomy base of the 1st MT ostectomy of the MTH with Aiken of the proximal phalanx left great toe and weil of the 2nd and 3rd MT today 09/27/16    HPI: Pt just had surgery of this morning. He walked in today because pt's wife states that his "post op shoe is too big, that his crutches are making him fall" and that there is an excessive amount of bleeding through the surgical dressing. The crutches have been adjusted and the dressing removed. They would like to have his foot checked to make sure that the pin has not been bent. The pt's wife believes that this may have happened since he left the surgical center. Rodena MedinAutumn L Mitchelle Goerner, RMA    Assessment & Plan: Visit Diagnoses: No diagnosis found.  Plan: Dry dressing applied continue nonweightbearing follow-up next week  Follow-Up Instructions: No Follow-up on file.   Ortho Exam Examination patient had blunt trauma to his great toe we bent the pin there was increased drainage from increase weightbearing the wound edges are well approximated no complications  Imaging: No results found.  Orders:  No orders of the defined types were placed in this encounter.  No orders of the defined types were placed in this encounter.    Procedures: No procedures performed  Clinical Data: No additional findings.  Subjective: Review of Systems  Objective: Vital Signs: Ht 5\' 6"  (1.676 m)   Wt 159 lb (72.1 kg)   BMI 25.66 kg/m   Specialty Comments:  No specialty comments available.  PMFS History: Patient Active Problem List   Diagnosis Date Noted  . Bunion of left foot   . Claw toe, acquired, left    No past  medical history on file.  No family history on file.  Past Surgical History:  Procedure Laterality Date  . HERNIA REPAIR     Social History   Occupational History  . Not on file.   Social History Main Topics  . Smoking status: Current Every Day Smoker    Packs/day: 0.50  . Smokeless tobacco: Never Used  . Alcohol use No  . Drug use: No  . Sexual activity: Not on file

## 2016-09-27 NOTE — Transfer of Care (Signed)
Immediate Anesthesia Transfer of Care Note  Patient: Walter Adams  Procedure(s) Performed: Procedure(s): Opening Wedge Osteotomy Base of the 1st Metatarsal Left Foot, Ostectomy of the Metatarsal Head with Aiken Osteotomy of the Proximal Phalanx Left Great Toe, and Weil Osteotomy 2nd and 3rd Metatarsal (Left)  Patient Location: PACU  Anesthesia Type:GA combined with regional for post-op pain  Level of Consciousness: awake, alert  and oriented  Airway & Oxygen Therapy: Patient Spontanous Breathing and Patient connected to nasal cannula oxygen  Post-op Assessment: Report given to RN and Post -op Vital signs reviewed and stable  Post vital signs: Reviewed and stable  Last Vitals:  Vitals:   09/27/16 0634 09/27/16 1206  BP: 120/73 103/83  Pulse: 77 66  Resp: 20 10  Temp: 36.8 C 36.1 C    Last Pain:  Vitals:   09/27/16 0634  TempSrc: Oral      Patients Stated Pain Goal: 0 (09/27/16 0557)  Complications: No apparent anesthesia complications

## 2016-09-27 NOTE — Anesthesia Preprocedure Evaluation (Addendum)
Anesthesia Evaluation  Patient identified by MRN, date of birth, ID band Patient awake    Reviewed: Allergy & Precautions, H&P , NPO status , Patient's Chart, lab work & pertinent test results  Airway Mallampati: II   Neck ROM: full    Dental   Pulmonary Current Smoker,    breath sounds clear to auscultation       Cardiovascular negative cardio ROS   Rhythm:regular Rate:Normal     Neuro/Psych    GI/Hepatic   Endo/Other    Renal/GU      Musculoskeletal   Abdominal   Peds  Hematology   Anesthesia Other Findings   Reproductive/Obstetrics                             Anesthesia Physical Anesthesia Plan  ASA: II  Anesthesia Plan: MAC and Regional   Post-op Pain Management:    Induction: Intravenous  Airway Management Planned: Simple Face Mask  Additional Equipment:   Intra-op Plan:   Post-operative Plan:   Informed Consent: I have reviewed the patients History and Physical, chart, labs and discussed the procedure including the risks, benefits and alternatives for the proposed anesthesia with the patient or authorized representative who has indicated his/her understanding and acceptance.     Plan Discussed with: CRNA, Anesthesiologist and Surgeon  Anesthesia Plan Comments:        Anesthesia Quick Evaluation

## 2016-09-27 NOTE — Anesthesia Postprocedure Evaluation (Signed)
Anesthesia Post Note  Patient: Walter Adams  Procedure(s) Performed: Procedure(s) (LRB): Opening Wedge Osteotomy Base of the 1st Metatarsal Left Foot, Ostectomy of the Metatarsal Head with Aiken Osteotomy of the Proximal Phalanx Left Great Toe, and Weil Osteotomy 2nd and 3rd Metatarsal (Left)  Patient location during evaluation: PACU Anesthesia Type: Regional and General Level of consciousness: awake and alert Pain management: pain level controlled Vital Signs Assessment: post-procedure vital signs reviewed and stable Respiratory status: spontaneous breathing, nonlabored ventilation, respiratory function stable and patient connected to nasal cannula oxygen Cardiovascular status: blood pressure returned to baseline and stable Postop Assessment: no signs of nausea or vomiting Anesthetic complications: no       Last Vitals:  Vitals:   09/27/16 1245 09/27/16 1300  BP: 113/90   Pulse: (!) 58 (!) 51  Resp: 16 15  Temp:  36.4 C    Last Pain:  Vitals:   09/27/16 0634  TempSrc: Oral                 Nyilah Kight S

## 2016-09-27 NOTE — H&P (Signed)
Walter Adams is an 56 y.o. male.   Chief Complaint: Painful left foot bunion and claw toe deformity. HPI: Patient is a 56 year old gentleman who has failed prolonged conservative therapy for a bunion and claw toe deformity left foot. Patient has pain with activities of daily living has tried shoewear modifications without relief and presents at this time for surgical intervention.  History reviewed. No pertinent past medical history.  Past Surgical History:  Procedure Laterality Date  . HERNIA REPAIR      History reviewed. No pertinent family history. Social History:  reports that he has been smoking.  He has been smoking about 0.50 packs per day. He has never used smokeless tobacco. He reports that he does not drink alcohol or use drugs.  Allergies:  Allergies  Allergen Reactions  . No Known Allergies     No prescriptions prior to admission.    No results found for this or any previous visit (from the past 48 hour(s)). No results found.  Review of Systems  All other systems reviewed and are negative.   There were no vitals taken for this visit. Physical Exam  On examination patient is alert oriented no adenopathy well-dressed normal affect normal S2 effort he has a normal gait. Examination patient has a good dorsalis pedis pulse he has bunion deformity with no pain with range of motion the MTP joint. His pain to palpation beneath the second metatarsal head with fixed clawing of the second toe. Assessment/Plan Assessment: Bunion and claw toe deformity left foot.   Plan: We will plan for an opening wedge osteotomy base of the first metatarsal Akin osteotomy the proximal phalanx with a Weil osteotomy for the second metatarsal. Risk and benefits were discussed including infection neurovascular injury persistent pain and need for additional surgery. Patient states he understands wish to proceed at this time.  Nadara MustardMarcus V Duda, MD 09/27/2016, 6:22 AM

## 2016-09-27 NOTE — Progress Notes (Signed)
Orthopedic Tech Progress Note Patient Details:  Boone MasterRobert C Mees 03/20/1961 [id number]  Ortho Devices Type of Ortho Device: Postop shoe/boot, Crutches Ortho Device/Splint Location: lle Ortho Device/Splint Interventions: Application   Myka Hitz 09/27/2016, 1:59 PM Viewed order from doctor's order list

## 2016-09-27 NOTE — Anesthesia Procedure Notes (Signed)
Procedure Name: LMA Insertion Date/Time: 09/27/2016 11:02 AM Performed by: Marena ChancyBECKNER, Allyssia Skluzacek S Pre-anesthesia Checklist: Patient identified, Emergency Drugs available, Suction available and Patient being monitored Patient Re-evaluated:Patient Re-evaluated prior to inductionOxygen Delivery Method: Circle System Utilized Preoxygenation: Pre-oxygenation with 100% oxygen Intubation Type: IV induction Ventilation: Mask ventilation without difficulty LMA: LMA inserted LMA Size: 4.0 Number of attempts: 1 Airway Equipment and Method: Bite block Placement Confirmation: positive ETCO2 Tube secured with: Tape Dental Injury: Teeth and Oropharynx as per pre-operative assessment

## 2016-09-27 NOTE — Op Note (Signed)
09/27/2016  12:08 PM  PATIENT:  Walter Adams    PRE-OPERATIVE DIAGNOSIS:  Bunion Left Great Toe, Claw Toe Left Foot  POST-OPERATIVE DIAGNOSIS:  Same  PROCEDURE:  Opening Wedge Osteotomy Base of the 1st Metatarsal Left Foot, Ostectomy of the Metatarsal Head with Aiken Osteotomy of the Proximal Phalanx Left Great Toe, and Weil Osteotomy 2nd and 3rd Metatarsal  SURGEON:  Nadara MustardMarcus V Lynden Flemmer, MD  PHYSICIAN ASSISTANT:None ANESTHESIA:   General  PREOPERATIVE INDICATIONS:  Walter MasterRobert C Dowty is a  56 y.o. male with a diagnosis of Bunion Left Great Toe, Claw Toe Left Foot who failed conservative measures and elected for surgical management.    The risks benefits and alternatives were discussed with the patient preoperatively including but not limited to the risks of infection, bleeding, nerve injury, cardiopulmonary complications, the need for revision surgery, among others, and the patient was willing to proceed.  OPERATIVE IMPLANTS: Opening wedge 5 mm plate from Arthrex, 2 x 12 mm mini frag screws 2  OPERATIVE FINDINGS: Stable alignment internal fixation and stable alignment internal fixation  OPERATIVE PROCEDURE: Patient brought the operating room after undergoing a popliteal block. After adequate levels anesthesia obtained patient's left lower extremity was prepped using DuraPrep draped into a sterile field a timeout was called. 2 medial longitudinal incision was made one of the MTP joint went over the proximal aspect the first metatarsal. Attention was first focused on the base of the first metatarsal. A opening wedge incision was made 1 cm proximal to the base of the first metatarsal. This was opened 5 mm and a 5 mm distraction plate was placed with 2 screws proximally and 2 screws distally. This was then packed with bone graft of cancellus chips as well as the bone from the osteotomy distally. A ostectomy was first performed over the first metatarsal head. The abductors were released lateral aspect  of the MTP joint to allow for the toe to straighten. A Aiken osteotomy is performed base of the first metatarsal medial column was straightened and stabilized with 0.0625 K wire. The wounds were irrigated normal saline was closed using 2-0 Vicryl the skin was closed using 2-0 nylon. A separate incision was made over the second metatarsal. A Weil osteotomies performed and second metatarsal metatarsal head was translated approximately overhanging bone was resected and this was stabilized with a 2 x 12 mm snap off screw. Tension was then focused on the third metatarsal a Weil osteotomy was performed and third metatarsal metatarsal head was translated proximally overhanging bone was resected and stabilized with a 2 x 12 mm mini frag snap off screw. The wound was irrigated normal saline incision was closed using 2-0 nylon a sterile dressing was applied patient was extubated taken the PACU in stable condition.

## 2016-09-27 NOTE — Progress Notes (Signed)
   Office Visit Note   Patient: Walter Adams           Date of Birth: 12/12/1960           MRN: [id number] Visit Date: 09/27/2016              Requested by: Fleet ContrasEdwin Avbuere, MD [address] [place], [place] [id number] PCP: Dorrene GermanEdwin A Avbuere, MD  Chief Complaint  Patient presents with  . Left Foot - Wound Check    Opening wedge osteotomy base of the 1st MT ostectomy of the MTH with Aiken of the proximal phalanx left great toe and weil of the 2nd and 3rd MT today 09/27/16    HPI: HPI  Assessment & Plan: Visit Diagnoses:  1. Bunion of great toe left   2. Claw toe, acquired, left     Plan: Dressing change daily (dressing changes at this time continue nonweightbearing follow-up in 1 week as scheduled  Follow-Up Instructions: Return in about 1 week (around 10/04/2016).   Ortho Exam Examination the wound edges are well approximated patient bumped his toe bent the pin and had increased drainage from increased weightbearing. There is no signs of any complications. A new dressing was applied.  Imaging: No results found.  Orders:  No orders of the defined types were placed in this encounter.  No orders of the defined types were placed in this encounter.    Procedures: No procedures performed  Clinical Data: No additional findings.  Subjective: Review of Systems  Objective: Vital Signs: Ht 5\' 6"  (1.676 m)   Wt 159 lb (72.1 kg)   BMI 25.66 kg/m   Specialty Comments:  No specialty comments available.  PMFS History: Patient Active Problem List   Diagnosis Date Noted  . Bunion of great toe   . Claw toe, acquired, left    No past medical history on file.  No family history on file.  Past Surgical History:  Procedure Laterality Date  . HERNIA REPAIR     Social History   Occupational History  . Not on file.   Social History Main Topics  . Smoking status: Current Every Day Smoker    Packs/day: 0.50  . Smokeless tobacco: Never Used  . Alcohol use No  . Drug  use: No  . Sexual activity: Not on file

## 2016-09-27 NOTE — Anesthesia Procedure Notes (Signed)
Anesthesia Regional Block: Popliteal block   Pre-Anesthetic Checklist: ,, timeout performed, Correct Patient, Correct Site, Correct Laterality, Correct Procedure, Correct Position, site marked, Risks and benefits discussed,  Surgical consent,  Pre-op evaluation,  At surgeon's request and post-op pain management  Laterality: Left  Prep: chloraprep       Needles:  Injection technique: Single-shot  Needle Type: Echogenic Stimulator Needle          Additional Needles:   Procedures: ultrasound guided, nerve stimulator,,,,,,   Nerve Stimulator or Paresthesia:  Response: plantar flexion of foot, 0.45 mA,   Additional Responses:   Narrative:  Start time: 09/27/2016 7:47 AM End time: 09/27/2016 7:52 AM Injection made incrementally with aspirations every 5 mL.  Performed by: Personally  Anesthesiologist: Aleksey Newbern  Additional Notes: Functioning IV was confirmed and monitors were applied.  A 90mm 21ga Arrow echogenic stimulator needle was used. Sterile prep and drape,hand hygiene and sterile gloves were used.  Negative aspiration and negative test dose prior to incremental administration of local anesthetic. The patient tolerated the procedure well.  Ultrasound guidance: relevent anatomy identified, needle position confirmed, local anesthetic spread visualized around nerve(s), vascular puncture avoided.  Image printed for medical record.

## 2016-09-29 ENCOUNTER — Encounter (HOSPITAL_COMMUNITY): Payer: Self-pay | Admitting: Orthopedic Surgery

## 2016-10-03 ENCOUNTER — Telehealth (INDEPENDENT_AMBULATORY_CARE_PROVIDER_SITE_OTHER): Payer: Self-pay | Admitting: Orthopedic Surgery

## 2016-10-03 NOTE — Telephone Encounter (Signed)
Rec'd call from Ms. Pompei advised the pain medicine is not working and Mr. Walter Adams is in a lot of pain. She asked for a call back as soon as possible. The number to contact her is [phone]

## 2016-10-03 NOTE — Telephone Encounter (Signed)
I called and spoke with patients wife she advised of message below. Advised patient would need to be seen in office to make sure there is not something else going on to determine if this is post operative pain normal or possible infection or complication. Made appointment tomorrow at 315pm. Patient would like to see MD only.

## 2016-10-04 ENCOUNTER — Encounter (INDEPENDENT_AMBULATORY_CARE_PROVIDER_SITE_OTHER): Payer: Self-pay | Admitting: Orthopedic Surgery

## 2016-10-04 ENCOUNTER — Inpatient Hospital Stay (INDEPENDENT_AMBULATORY_CARE_PROVIDER_SITE_OTHER): Payer: 59 | Admitting: Orthopedic Surgery

## 2016-10-04 ENCOUNTER — Ambulatory Visit (INDEPENDENT_AMBULATORY_CARE_PROVIDER_SITE_OTHER): Payer: 59 | Admitting: Family

## 2016-10-04 VITALS — Ht 66.0 in | Wt 159.0 lb

## 2016-10-04 DIAGNOSIS — M21619 Bunion of unspecified foot: Secondary | ICD-10-CM

## 2016-10-04 DIAGNOSIS — M205X2 Other deformities of toe(s) (acquired), left foot: Secondary | ICD-10-CM

## 2016-10-04 MED ORDER — OXYCODONE-ACETAMINOPHEN 5-325 MG PO TABS: 1.0000 | ORAL_TABLET | Freq: Four times a day (QID) | ORAL | 0 refills | Status: DC | PRN

## 2016-10-04 NOTE — Progress Notes (Signed)
Office Visit Note   Patient: Walter Adams           Date of Birth: September 01, 1960           MRN: [id number] Visit Date: 10/04/2016              Requested by: Fleet Contras, MD [address] [place], [place] [address] PCP: Dorrene German, MD  Chief Complaint  Patient presents with  . Left Foot - Routine Post Op    Opening wedge osteotomy base of the 1st MT ostectomy of the MTH with Aiken of the proximal phalanx left great toe and weil of the 2nd and 3rd MT today 09/27/16      HPI: Left foot pin and stitches are intact. There is a moderate amount of dry bloody drainage. There is some swelling. The pt's wife states that he is having a lot of swelling and that his leg had "turned blue" he is non weight bearing with crutches in a post op shoe. Rodena Medin, RMA    Assessment & Plan: Visit Diagnoses:  1. Bunion of great toe   2. Claw toe, acquired, left     Plan: follow up in 1 more week. Will pull pin and harvest sutures at that time. Have provided a new prescription for percocet for pain. Advised to cleanse incision daily and apply dry dressings. Elevate for swelling.   Follow-Up Instructions: Return in about 1 week (around 10/11/2016).   Ortho Exam Incisions well approximated with sutures. Moderate swelling. No erythema or warmth. No sign of infection. Pin in place.   Imaging: No results found.  Labs: Lab Results  Component Value Date   REPTSTATUS 04/16/2013 FINAL 04/13/2013   GRAMSTAIN  04/13/2013    FEW WBC PRESENT,BOTH PMN AND MONONUCLEAR NO SQUAMOUS EPITHELIAL CELLS SEEN NO ORGANISMS SEEN Performed at [facility]   CULT  04/13/2013    MULTIPLE ORGANISMS PRESENT, NONE PREDOMINANT NO STAPHYLOCOCCUS AUREUS ISOLATED NO GROUP A STREP (S.PYOGENES) ISOLATED Performed at [facility]    Orders:  No orders of the defined types were placed in this encounter.  Meds ordered this encounter  Medications  . oxyCODONE-acetaminophen (PERCOCET/ROXICET)  5-325 MG tablet    Sig: Take 1 tablet by mouth every 6 (six) hours as needed for severe pain.    Dispense:  30 tablet    Refill:  0     Procedures: No procedures performed  Clinical Data: No additional findings.  Subjective: Review of Systems  Constitutional: Negative for chills and fever.  Cardiovascular: Positive for leg swelling.    Objective: Vital Signs: Ht 5\' 6"  (1.676 m)   Wt 159 lb (72.1 kg)   BMI 25.66 kg/m   Specialty Comments:  No specialty comments available.  PMFS History: Patient Active Problem List   Diagnosis Date Noted  . Bunion of great toe   . Claw toe, acquired, left    No past medical history on file.  No family history on file.  Past Surgical History:  Procedure Laterality Date  . HERNIA REPAIR    . WEIL OSTEOTOMY Left 09/27/2016   Procedure: Opening Wedge Osteotomy Base of the 1st Metatarsal Left Foot, Ostectomy of the Metatarsal Head with Aiken Osteotomy of the Proximal Phalanx Left Great Toe, and Weil Osteotomy 2nd and 3rd Metatarsal;  Surgeon: Nadara Mustard, MD;  Location: [facility] OR;  Service: Orthopedics;  Laterality: Left;   Social History   Occupational History  . Not on file.  Social History Main Topics  . Smoking status: Current Every Day Smoker    Packs/day: 0.50  . Smokeless tobacco: Never Used  . Alcohol use No  . Drug use: No  . Sexual activity: Not on file

## 2016-10-05 ENCOUNTER — Inpatient Hospital Stay (INDEPENDENT_AMBULATORY_CARE_PROVIDER_SITE_OTHER): Payer: 59 | Admitting: Orthopedic Surgery

## 2016-10-11 ENCOUNTER — Telehealth (INDEPENDENT_AMBULATORY_CARE_PROVIDER_SITE_OTHER): Payer: Self-pay | Admitting: Orthopaedic Surgery

## 2016-10-11 ENCOUNTER — Ambulatory Visit (INDEPENDENT_AMBULATORY_CARE_PROVIDER_SITE_OTHER): Payer: 59 | Admitting: Orthopedic Surgery

## 2016-10-11 NOTE — Telephone Encounter (Signed)
Patient called asking if he could be sedated tomorrow whenever they remove the pin from his foot? CB [phone]

## 2016-10-11 NOTE — Telephone Encounter (Signed)
I called and sw pt to advise. He voiced understanding that we can not sedate him for his appt tomorrow.

## 2016-10-11 NOTE — Telephone Encounter (Signed)
Think this came to me by mistake-

## 2016-10-12 ENCOUNTER — Encounter (INDEPENDENT_AMBULATORY_CARE_PROVIDER_SITE_OTHER): Payer: Self-pay | Admitting: Family

## 2016-10-12 ENCOUNTER — Ambulatory Visit (INDEPENDENT_AMBULATORY_CARE_PROVIDER_SITE_OTHER): Payer: 59 | Admitting: Family

## 2016-10-12 VITALS — Ht 66.0 in | Wt 159.0 lb

## 2016-10-12 DIAGNOSIS — M21619 Bunion of unspecified foot: Secondary | ICD-10-CM

## 2016-10-12 DIAGNOSIS — M205X2 Other deformities of toe(s) (acquired), left foot: Secondary | ICD-10-CM

## 2016-10-12 NOTE — Progress Notes (Signed)
   Office Visit Note   Patient: Walter Adams           Date of Birth: 10/01/1960           MRN: [id number] Visit Date: 10/12/2016              Requested by: Fleet ContrasEdwin Avbuere, MD [address] [place], [place] [id number] PCP: Dorrene GermanEdwin A Avbuere, MD  Chief Complaint  Patient presents with  . Left Foot - Routine Post Op    09/27/16 Opening Wedge Osteotomy Base of the 1st Metatarsal Left Foot, Ostectomy of the Metatarsal Head with Aiken Osteotomy of the Proximal Phalanx Left Great Toe, and Weil Osteotomy 2nd and 3rd Metatarsal    HPI: Patient is a 56 year old gentleman seen 2 weeks status post claw toe and bunionectomy left foot. Has been doing daily dial soap cleansing. No concerns.     Assessment & Plan: Visit Diagnoses:  1. Bunion of great toe   2. Claw toe, acquired, left     Plan: Follow up in 2 more weeks. Remain in post op shoe. Have pulled pin and harvested sutures today without incident. Continue wound cleansing and dry dressings.   Follow-Up Instructions: No Follow-up on file.   Ortho Exam Incisions are well approximated. Healing well. Scant dry blood. Minimal swelling. No surrounding erythema, drainage or odor.   Imaging: No results found.  Labs: Lab Results  Component Value Date   REPTSTATUS 04/16/2013 FINAL 04/13/2013   GRAMSTAIN  04/13/2013    FEW WBC PRESENT,BOTH PMN AND MONONUCLEAR NO SQUAMOUS EPITHELIAL CELLS SEEN NO ORGANISMS SEEN [facility]   CULT  04/13/2013    MULTIPLE ORGANISMS PRESENT, NONE PREDOMINANT NO STAPHYLOCOCCUS AUREUS ISOLATED NO GROUP A STREP (S.PYOGENES) ISOLATED [facility]    Orders:  No orders of the defined types were placed in this encounter.  No orders of the defined types were placed in this encounter.    Procedures: No procedures performed  Clinical Data: No additional findings.  Subjective: Review of Systems  Constitutional: Negative for chills and fever.     Objective: Vital Signs: Ht 5\' 6"  (1.676 m)   Wt 159 lb (72.1 kg)   BMI 25.66 kg/m   Specialty Comments:  No specialty comments available.  PMFS History: Patient Active Problem List   Diagnosis Date Noted  . Bunion of great toe   . Claw toe, acquired, left    No past medical history on file.  No family history on file.  Past Surgical History:  Procedure Laterality Date  . HERNIA REPAIR    . WEIL OSTEOTOMY Left 09/27/2016   Procedure: Opening Wedge Osteotomy Base of the 1st Metatarsal Left Foot, Ostectomy of the Metatarsal Head with Aiken Osteotomy of the Proximal Phalanx Left Great Toe, and Weil Osteotomy 2nd and 3rd Metatarsal;  Surgeon: Nadara MustardMarcus Duda V, MD;  Location: [facility] OR;  Service: Orthopedics;  Laterality: Left;   Social History   Occupational History  . Not on file.   Social History Main Topics  . Smoking status: Current Every Day Smoker    Packs/day: 0.50  . Smokeless tobacco: Never Used  . Alcohol use No  . Drug use: No  . Sexual activity: Not on file

## 2016-10-13 ENCOUNTER — Telehealth (INDEPENDENT_AMBULATORY_CARE_PROVIDER_SITE_OTHER): Payer: Self-pay | Admitting: Orthopedic Surgery

## 2016-10-13 ENCOUNTER — Other Ambulatory Visit (INDEPENDENT_AMBULATORY_CARE_PROVIDER_SITE_OTHER): Payer: Self-pay | Admitting: Family

## 2016-10-13 MED ORDER — OXYCODONE-ACETAMINOPHEN 5-325 MG PO TABS: 1.0000 | ORAL_TABLET | Freq: Four times a day (QID) | ORAL | 0 refills | Status: DC | PRN

## 2016-10-13 NOTE — Telephone Encounter (Signed)
Denny Peonrin is going to call back discussing patient concerns.

## 2016-10-13 NOTE — Telephone Encounter (Signed)
Yes, I will call

## 2016-10-13 NOTE — Telephone Encounter (Signed)
I called and left voicemail for patient to see what he is currently taking for pain. There is no answer. Can you follow up with him this afternoon?

## 2016-10-13 NOTE — Telephone Encounter (Signed)
Patient called saying he's still in a lot of pain. Was wanting to speak with you about his options. CB # [phone]

## 2016-10-13 NOTE — Telephone Encounter (Signed)
I know you just called Mr. Walter Adams a few minutes ago and left a message, but his wife would really like to speak with you specifically about his medication. Thank you! CB # [phone]

## 2016-10-17 ENCOUNTER — Ambulatory Visit (INDEPENDENT_AMBULATORY_CARE_PROVIDER_SITE_OTHER): Payer: 59 | Admitting: Orthopedic Surgery

## 2016-10-20 ENCOUNTER — Ambulatory Visit (INDEPENDENT_AMBULATORY_CARE_PROVIDER_SITE_OTHER): Payer: 59 | Admitting: Orthopedic Surgery

## 2016-10-20 ENCOUNTER — Encounter (INDEPENDENT_AMBULATORY_CARE_PROVIDER_SITE_OTHER): Payer: Self-pay | Admitting: Orthopedic Surgery

## 2016-10-20 DIAGNOSIS — M21619 Bunion of unspecified foot: Secondary | ICD-10-CM

## 2016-10-20 DIAGNOSIS — M205X2 Other deformities of toe(s) (acquired), left foot: Secondary | ICD-10-CM

## 2016-10-20 NOTE — Progress Notes (Signed)
Office Visit Note   Patient: Walter MasterRobert C Boyd           Date of Birth: 06/22/1961           MRN: [id number] Visit Date: 10/20/2016              Requested by: Fleet ContrasEdwin Avbuere, MD [address] [place], [place] [address] PCP: Dorrene GermanEdwin A Avbuere, MD  Chief Complaint  Patient presents with  . Left Foot - Pain    Opening Wedge Osteotomy Base of the 1st Metatarsal Left Foot, Ostectomy of the Metatarsal Head with Aiken Osteotomy of the Proximal Phalanx Left Great Toe, and Weil Osteotomy 2nd and 3rd Metatarsal ~3 wks post op    HPI: Patient is status post bunion claw toe surgery he is 3 weeks out  Assessment & Plan: Visit Diagnoses:  1. Bunion of great toe   2. Claw toe, acquired, left     Plan: Advance to stiff soled walking sneaker lotion to the left foot 3 times a day. Patient currently is written out of work for 3 months. He currently does not feel like he would be able to return to his regular work. We will need to reevaluate this status prior to the end of April.  Follow-Up Instructions: Return in about 3 weeks (around 11/10/2016).   Ortho Exam  Patient is alert, oriented, no adenopathy, well-dressed, normal affect, normal respiratory effort. Examination the incisions are well-healed there is dry cracked flaky skin no cellulitis no drainage no signs of infection he is good alignment of his toes. Patient is given instructions for heel cord stretching as well as plantar flexion stretching of the second toe.  Imaging: No results found.  Labs: Lab Results  Component Value Date   REPTSTATUS 04/16/2013 FINAL 04/13/2013   GRAMSTAIN  04/13/2013    FEW WBC PRESENT,BOTH PMN AND MONONUCLEAR NO SQUAMOUS EPITHELIAL CELLS SEEN NO ORGANISMS SEEN Performed at [facility]   CULT  04/13/2013    MULTIPLE ORGANISMS PRESENT, NONE PREDOMINANT NO STAPHYLOCOCCUS AUREUS ISOLATED NO GROUP A STREP (S.PYOGENES) ISOLATED Performed at [facility]    Orders:  No orders of the  defined types were placed in this encounter.  No orders of the defined types were placed in this encounter.    Procedures: No procedures performed  Clinical Data: No additional findings.  ROS: Review of Systems  Constitutional: Negative for chills and fever.  All other systems reviewed and are negative.   Objective: Vital Signs: There were no vitals taken for this visit.  Specialty Comments:  No specialty comments available.  PMFS History: Patient Active Problem List   Diagnosis Date Noted  . Bunion of great toe   . Claw toe, acquired, left    History reviewed. No pertinent past medical history.  History reviewed. No pertinent family history.  Past Surgical History:  Procedure Laterality Date  . HERNIA REPAIR    . WEIL OSTEOTOMY Left 09/27/2016   Procedure: Opening Wedge Osteotomy Base of the 1st Metatarsal Left Foot, Ostectomy of the Metatarsal Head with Aiken Osteotomy of the Proximal Phalanx Left Great Toe, and Weil Osteotomy 2nd and 3rd Metatarsal;  Surgeon: Nadara MustardMarcus Mohammed Mcandrew V, MD;  Location: [facility];  Service: Orthopedics;  Laterality: Left;   Social History   Occupational History  . Not on file.   Social History Main Topics  . Smoking status: Current Every Day Smoker    Packs/day: 0.50  . Smokeless tobacco: Never Used  . Alcohol use No  .  Drug use: No  . Sexual activity: Not on file       

## 2016-10-26 ENCOUNTER — Telehealth (INDEPENDENT_AMBULATORY_CARE_PROVIDER_SITE_OTHER): Payer: Self-pay | Admitting: *Deleted

## 2016-10-26 ENCOUNTER — Ambulatory Visit (INDEPENDENT_AMBULATORY_CARE_PROVIDER_SITE_OTHER): Payer: 59 | Admitting: Orthopedic Surgery

## 2016-10-26 NOTE — Telephone Encounter (Signed)
Patient's wife called this afternoon in regards to her husbands condition from surgery. She is very concerned because he still having a lot of pain and still can not fit in his shoe or boot or anything. Her CB # [phone]. Thank you

## 2016-10-26 NOTE — Telephone Encounter (Signed)
I called patient's wife he is still having swelling discussed the importance of elevation he can use ice and recommended compression stockings. Will follow-up in the office as scheduled and will allow him to return to work once the swelling has resolved.

## 2016-11-09 ENCOUNTER — Encounter (INDEPENDENT_AMBULATORY_CARE_PROVIDER_SITE_OTHER): Payer: Self-pay | Admitting: Orthopedic Surgery

## 2016-11-09 ENCOUNTER — Ambulatory Visit (INDEPENDENT_AMBULATORY_CARE_PROVIDER_SITE_OTHER): Payer: 59 | Admitting: Orthopedic Surgery

## 2016-11-09 VITALS — Ht 66.0 in | Wt 159.0 lb

## 2016-11-09 DIAGNOSIS — M21619 Bunion of unspecified foot: Secondary | ICD-10-CM

## 2016-11-09 DIAGNOSIS — M205X2 Other deformities of toe(s) (acquired), left foot: Secondary | ICD-10-CM

## 2016-11-09 NOTE — Progress Notes (Signed)
Office Visit Note   Patient: Walter Adams           Date of Birth: 21-Apr-1961           MRN: [id number] Visit Date: 11/09/2016              Requested by: Fleet Contras, MD [address] [place], [place] [address] PCP: Dorrene German, MD  Chief Complaint  Patient presents with  . Left Foot - Pain      HPI: Patient is about 6 weeks status post bunion and claw toe surgery for the left foot. Patient states he still has some numbness and great toe has some callus beneath the metatarsal heads. Patient states he is unable get into regular shoe wear at this time requesting a temporary handicap parking. He has stopped the Percocet because it wasn't working he is taking anti-inflammatories as needed. Patient states he is unable to work yet due to the foot pain.  Assessment & Plan: Visit Diagnoses:  1. Bunion of great toe   2. Claw toe, acquired, left     Plan: Will have patient continue out of work as planned through May 28. Follow-up in 4 weeks with repeat 3 view radiographs of the left foot. Reevaluate for return to work date at that time.  Follow-Up Instructions: Return in about 4 weeks (around 12/07/2016).   Ortho Exam  Patient is alert, oriented, no adenopathy, well-dressed, normal affect, normal respiratory effort. The incision is well healed patient is developing heel cord tightness and patient was demonstrating instructions on heel cord stretching. The incision is well healed he does have tenderness to palpation at the opening wedge osteotomy site. We will repeat radiographs at follow-up. He will continue with his crutches continue with his postoperative shoe.  Imaging: No results found.  Labs: Lab Results  Component Value Date   REPTSTATUS 04/16/2013 FINAL 04/13/2013   GRAMSTAIN  04/13/2013    FEW WBC PRESENT,BOTH PMN AND MONONUCLEAR NO SQUAMOUS EPITHELIAL CELLS SEEN NO ORGANISMS SEEN Performed at [facility]   CULT  04/13/2013    MULTIPLE ORGANISMS  PRESENT, NONE PREDOMINANT NO STAPHYLOCOCCUS AUREUS ISOLATED NO GROUP A STREP (S.PYOGENES) ISOLATED Performed at [facility]    Orders:  No orders of the defined types were placed in this encounter.  No orders of the defined types were placed in this encounter.    Procedures: No procedures performed  Clinical Data: No additional findings.  ROS:  All other systems negative, except as noted in the HPI. Review of Systems  Objective: Vital Signs: Ht  (1.676 m)   Wt 159 lb (72.1 kg)   BMI 25.66 kg/m   Specialty Comments:  No specialty comments available.  PMFS History: Patient Active Problem List   Diagnosis Date Noted  . Bunion of great toe   . Claw toe, acquired, left    History reviewed. No pertinent past medical history.  History reviewed. No pertinent family history.  Past Surgical History:  Procedure Laterality Date  . HERNIA REPAIR    . WEIL OSTEOTOMY Left 09/27/2016   Procedure: Opening Wedge Osteotomy Base of the 1st Metatarsal Left Foot, Ostectomy of the Metatarsal Head with Aiken Osteotomy of the Proximal Phalanx Left Great Toe, and Weil Osteotomy 2nd and 3rd Metatarsal;  Surgeon: Nadara Mustard, MD;  Location: [facility];  Service: Orthopedics;  Laterality: Left;   Social History   Occupational History  . Not on file.   Social History Main Topics  .  Smoking status: Current Every Day Smoker    Packs/day: 0.50  . Smokeless tobacco: Never Used  . Alcohol use No  . Drug use: No  . Sexual activity: Not on file

## 2016-12-08 ENCOUNTER — Telehealth (INDEPENDENT_AMBULATORY_CARE_PROVIDER_SITE_OTHER): Payer: Self-pay

## 2016-12-08 ENCOUNTER — Encounter (INDEPENDENT_AMBULATORY_CARE_PROVIDER_SITE_OTHER): Payer: Self-pay | Admitting: Orthopedic Surgery

## 2016-12-08 ENCOUNTER — Telehealth (INDEPENDENT_AMBULATORY_CARE_PROVIDER_SITE_OTHER): Payer: Self-pay | Admitting: Orthopedic Surgery

## 2016-12-08 ENCOUNTER — Ambulatory Visit (INDEPENDENT_AMBULATORY_CARE_PROVIDER_SITE_OTHER): Payer: 59 | Admitting: Orthopedic Surgery

## 2016-12-08 VITALS — Ht 66.0 in | Wt 159.0 lb

## 2016-12-08 DIAGNOSIS — M205X2 Other deformities of toe(s) (acquired), left foot: Secondary | ICD-10-CM

## 2016-12-08 DIAGNOSIS — M21619 Bunion of unspecified foot: Secondary | ICD-10-CM

## 2016-12-08 NOTE — Telephone Encounter (Signed)
Pt's wife called and states that the pt needs his work note to say "something different and Dr. Lajoyce Cornersuda will know what it is" wants the light duty seated work to be removed as a permanent restriction off his work note so that he can be terminated and has found employment elsewhere that he can do seated work but he can not start another [facility]. Please advise.

## 2016-12-08 NOTE — Progress Notes (Signed)
   Office Visit Note   Patient: Walter MasterRobert C Moya           Date of Birth: 03/15/1961           MRN: [id number] Visit Date: 12/08/2016              Requested by: Fleet ContrasAvbuere, Edwin, MD [address] [place], [place] [address] PCP: Fleet ContrasAvbuere, Edwin, MD  Chief Complaint  Patient presents with  . Left Foot - Routine Post Op    10 weeks s/p bunion and claw toe surgery      HPI: Patient complains of increasing pain and swelling Venodyne complains of throbbing in the day and states he still has numbness along the surgical incisions. He states he cannot wear regular shoe and states he cannot put the compression stockings on.  Assessment & Plan: Visit Diagnoses:  1. Bunion of great toe   2. Claw toe, acquired, left     Plan: Patient was given a note that a permanent restriction would be seated light-duty work he cannot return to the level of work that he was doing before. Anticipate patient should not resume his previous level of work.  Follow-Up Instructions: Return in about 4 weeks (around 01/05/2017).   Ortho Exam  Patient is alert, oriented, no adenopathy, well-dressed, normal affect, normal respiratory effort. Examination incisions are well healed he does have the swelling is no redness no cellulitis no signs of infection he does have a numbness along the surgical incisions.  Imaging: No results found.  Labs: Lab Results  Component Value Date   REPTSTATUS 04/16/2013 FINAL 04/13/2013   GRAMSTAIN  04/13/2013    FEW WBC PRESENT,BOTH PMN AND MONONUCLEAR NO SQUAMOUS EPITHELIAL CELLS SEEN NO ORGANISMS SEEN Performed at [facility]   CULT  04/13/2013    MULTIPLE ORGANISMS PRESENT, NONE PREDOMINANT NO STAPHYLOCOCCUS AUREUS ISOLATED NO GROUP A STREP (S.PYOGENES) ISOLATED Performed at [facility]    Orders:  No orders of the defined types were placed in this encounter.  No orders of the defined types were placed in this encounter.    Procedures: No procedures  performed  Clinical Data: No additional findings.  ROS:  All other systems negative, except as noted in the HPI. Review of Systems  Objective: Vital Signs: Ht 5\' 6"  (1.676 m)   Wt 159 lb (72.1 kg)   BMI 25.66 kg/m   Specialty Comments:  No specialty comments available.  PMFS History: Patient Active Problem List   Diagnosis Date Noted  . Bunion of great toe   . Claw toe, acquired, left    No past medical history on file.  No family history on file.  Past Surgical History:  Procedure Laterality Date  . HERNIA REPAIR    . WEIL OSTEOTOMY Left 09/27/2016   Procedure: Opening Wedge Osteotomy Base of the 1st Metatarsal Left Foot, Ostectomy of the Metatarsal Head with Aiken Osteotomy of the Proximal Phalanx Left Great Toe, and Weil Osteotomy 2nd and 3rd Metatarsal;  Surgeon: Nadara MustardMarcus Duda V, MD;  Location: [facility];  Service: Orthopedics;  Laterality: Left;   Social History   Occupational History  . Not on file.   Social History Main Topics  . Smoking status: Current Every Day Smoker    Packs/day: 0.50  . Smokeless tobacco: Never Used  . Alcohol use No  . Drug use: No  . Sexual activity: Not on file

## 2016-12-12 ENCOUNTER — Telehealth (INDEPENDENT_AMBULATORY_CARE_PROVIDER_SITE_OTHER): Payer: Self-pay | Admitting: Orthopedic Surgery

## 2016-12-12 NOTE — Telephone Encounter (Signed)
12/08/2016 OV NOTE FAXED TO [employer] [phone]

## 2016-12-13 ENCOUNTER — Telehealth (INDEPENDENT_AMBULATORY_CARE_PROVIDER_SITE_OTHER): Payer: Self-pay | Admitting: Orthopedic Surgery

## 2016-12-13 NOTE — Telephone Encounter (Signed)
Patient's wife Walter Adams called asked if she can get a call back concerning the note written by Dr Lajoyce Cornersuda. She advised the note need to state that Mr. Walter Adams has permanent restrictions so that Mr. Walter Adams can be terminated from his job. Walter Adams asked if Walter Adams would call her back. She advised there is not any light duty for him to do. The number to contact Walter Adams is [phone]

## 2016-12-13 NOTE — Telephone Encounter (Signed)
I called and spoke with patients wife [name]. She is wanting a note minus the light duty seated work. She would like it just to say permanent restrictions. I advised her once spoken with Dr. Lajoyce Cornersuda, who is in surgery right now, I will let her know what he says if such letter can be written.

## 2016-12-18 NOTE — Telephone Encounter (Signed)
Call patient back. I cannot legally say that he cannot sit since there was no surgery involved that would prohibit him from sitting. This should be enough information that if he talks to employ the benefits that they should be able to terminate his employment.

## 2016-12-18 NOTE — Telephone Encounter (Signed)
I called and sw pt's wife to advise. We spent a long time on the phone. I explained to here that we can not change his note that he was not capable of the work he was doing before and that seated light duty work is what he is able to do now.

## 2017-01-16 ENCOUNTER — Ambulatory Visit (INDEPENDENT_AMBULATORY_CARE_PROVIDER_SITE_OTHER): Payer: Self-pay | Admitting: Orthopedic Surgery

## 2017-01-16 ENCOUNTER — Encounter (INDEPENDENT_AMBULATORY_CARE_PROVIDER_SITE_OTHER): Payer: Self-pay | Admitting: Orthopedic Surgery

## 2017-01-16 VITALS — Ht 66.0 in | Wt 159.0 lb

## 2017-01-16 DIAGNOSIS — M21619 Bunion of unspecified foot: Secondary | ICD-10-CM

## 2017-01-16 NOTE — Progress Notes (Signed)
   Office Visit Note   Patient: Walter MasterRobert C Mizner           Date of Birth: 01/19/1961           MRN: [id number] Visit Date: 01/16/2017              Requested by: Fleet ContrasAvbuere, Edwin, MD [address] [place], [place] [address] PCP: Fleet ContrasAvbuere, Edwin, MD  Chief Complaint  Patient presents with  . Left Foot - Routine Post Op    16 weeks left foot bunion and claw toe surgery      HPI: Patient presents follow-up he is 3 months status post bunion claw toe surgery left foot. He does have venous stasis swelling he is not wearing his medical compression stockings he is also not wearing a stiff soled walking shoe.  Assessment & Plan: Visit Diagnoses:  1. Bunion of great toe     Plan: Recommend a stiff soled new balance walking shoe recommend that he wear the medical compression stockings daily  Follow-Up Instructions: Return in about 4 weeks (around 02/13/2017).   Ortho Exam  Patient is alert, oriented, no adenopathy, well-dressed, normal affect, normal respiratory effort. Examination patient does have decreased range of motion of the MTP joint of the great toe. Patient's foot has good alignment with no complicating features he does have some venous stasis swelling. There is no floating of the second toe. Patient subjectively has numbness of the dorsum of the great toe.  Imaging: No results found.  Labs: Lab Results  Component Value Date   REPTSTATUS 04/16/2013 FINAL 04/13/2013   GRAMSTAIN  04/13/2013    FEW WBC PRESENT,BOTH PMN AND MONONUCLEAR NO SQUAMOUS EPITHELIAL CELLS SEEN NO ORGANISMS SEEN Performed at [facility]   CULT  04/13/2013    MULTIPLE ORGANISMS PRESENT, NONE PREDOMINANT NO STAPHYLOCOCCUS AUREUS ISOLATED NO GROUP A STREP (S.PYOGENES) ISOLATED Performed at [facility]    Orders:  No orders of the defined types were placed in this encounter.  No orders of the defined types were placed in this encounter.    Procedures: No procedures  performed  Clinical Data: No additional findings.  ROS:  All other systems negative, except as noted in the HPI. Review of Systems  Objective: Vital Signs: Ht 5\' 6"  (1.676 m)   Wt 159 lb (72.1 kg)   BMI 25.66 kg/m   Specialty Comments:  No specialty comments available.  PMFS History: Patient Active Problem List   Diagnosis Date Noted  . Bunion of great toe   . Claw toe, acquired, left    No past medical history on file.  No family history on file.  Past Surgical History:  Procedure Laterality Date  . HERNIA REPAIR    . WEIL OSTEOTOMY Left 09/27/2016   Procedure: Opening Wedge Osteotomy Base of the 1st Metatarsal Left Foot, Ostectomy of the Metatarsal Head with Aiken Osteotomy of the Proximal Phalanx Left Great Toe, and Weil Osteotomy 2nd and 3rd Metatarsal;  Surgeon: Nadara MustardMarcus Hester Forget V, MD;  Location: [facility];  Service: Orthopedics;  Laterality: Left;   Social History   Occupational History  . Not on file.   Social History Main Topics  . Smoking status: Current Every Day Smoker    Packs/day: 0.50  . Smokeless tobacco: Never Used  . Alcohol use No  . Drug use: No  . Sexual activity: Not on file

## 2017-02-05 ENCOUNTER — Telehealth (INDEPENDENT_AMBULATORY_CARE_PROVIDER_SITE_OTHER): Payer: Self-pay | Admitting: *Deleted

## 2017-02-05 NOTE — Telephone Encounter (Signed)
PT CALLED STATING HER HUSBANDS BILL IS NOT CORRECT AND DR. DUDA STATED THAT HE WOULD MAKE SURE THIS WAS FIXED? I ADVISED THE PT TP CALL THE BILLING DEPARTMENT BUT SHE IS ADAMANT THAT DR DUDA CAN HELP WITH THIS.

## 2017-02-06 ENCOUNTER — Telehealth (INDEPENDENT_AMBULATORY_CARE_PROVIDER_SITE_OTHER): Payer: Self-pay | Admitting: Radiology

## 2017-02-06 NOTE — Telephone Encounter (Signed)
Sure. I don't mind calling patient. Would like to clear up some questions. His wife talked with a co worker in billing and stated Dr Lajoyce Cornersuda told them bill was incorrect that they should not have gotten a bill. Are charges correct on account?  Thank you, k

## 2017-02-06 NOTE — Telephone Encounter (Signed)
Per Dr. Lajoyce Cornersuda have them bring in the bill and let us know what is abnormal..Marland Kitchen.She advised me she does not know what is abnormal and wants Dr. Lajoyce Cornersuda to look at it to make sure charges are right.Marland Kitchen.Marland Kitchen.Advised that he will not be in office tomorrow but she can bring it in and have him review it on Thursday.

## 2017-02-08 NOTE — Telephone Encounter (Signed)
Dr. Lajoyce Cornersuda has reviewed patients bill all of the charges are correct. There is no mistake on the bill Dr. Lajoyce Cornersuda has confirmed. There are multiple charges because this was a multiple faceted surgery. Patient was not overcharged.

## 2017-02-09 NOTE — Telephone Encounter (Signed)
Patient walked into office and requested to get back the bill that was left for Dr. Lajoyce Cornersuda to review. Annette StableBill was given back to patient.

## 2017-02-21 ENCOUNTER — Telehealth (INDEPENDENT_AMBULATORY_CARE_PROVIDER_SITE_OTHER): Payer: Self-pay | Admitting: Radiology

## 2017-02-21 NOTE — Telephone Encounter (Signed)
Walter Adams advised this morning she will be coming to pay the remainder of the patients bill. They would also like to see Dr. Lajoyce Cornersuda advised her to just call back and schedule so they do get him while hes here in the office part of the week he will be in surgery. She expressed understanding.

## 2017-03-10 ENCOUNTER — Encounter (HOSPITAL_COMMUNITY): Payer: Self-pay | Admitting: Family Medicine

## 2017-03-10 ENCOUNTER — Ambulatory Visit (HOSPITAL_COMMUNITY)
Admission: EM | Admit: 2017-03-10 | Discharge: 2017-03-10 | Disposition: A | Discharge status: Home / Self Care | Payer: 59 | Attending: Family Medicine | Admitting: Family Medicine

## 2017-03-10 DIAGNOSIS — J40 Bronchitis, not specified as acute or chronic: Secondary | ICD-10-CM

## 2017-03-10 MED ORDER — PREDNISONE 20 MG PO TABS: ORAL_TABLET | ORAL | 0 refills | Status: DC

## 2017-03-10 MED ORDER — AZITHROMYCIN 250 MG PO TABS: 250.0000 mg | ORAL_TABLET | Freq: Every day | ORAL | 0 refills | Status: DC

## 2017-03-10 MED ORDER — HYDROCODONE-HOMATROPINE 5-1.5 MG/5ML PO SYRP: 5.0000 mL | ORAL_SOLUTION | Freq: Four times a day (QID) | ORAL | 0 refills | Status: DC | PRN

## 2017-03-10 NOTE — ED Triage Notes (Signed)
Pt here for sore throat and cough, sts phlegm in throat.

## 2017-03-10 NOTE — ED Provider Notes (Signed)
[facility]    CSN: [id number] Arrival date & time: 03/10/17  1723     History   Chief Complaint Chief Complaint  Patient presents with  . Sore Throat  . Cough    HPI Walter Adams is a 56 y.o. male.   This a 56 year old man who presents for evaluation of sore throat and cough. The symptoms began on Thursday and have been keeping the patient awake. Said no vomiting and the cough has been productive of phlegm intermittently. He's had chills and low-grade temperature as well.      History reviewed. No pertinent past medical history.  Patient Active Problem List   Diagnosis Date Noted  . Bunion of great toe   . Claw toe, acquired, left     Past Surgical History:  Procedure Laterality Date  . HERNIA REPAIR    . WEIL OSTEOTOMY Left 09/27/2016   Procedure: Opening Wedge Osteotomy Base of the 1st Metatarsal Left Foot, Ostectomy of the Metatarsal Head with Aiken Osteotomy of the Proximal Phalanx Left Great Toe, and Weil Osteotomy 2nd and 3rd Metatarsal;  Surgeon: Nadara Mustard, MD;  Location: [facility];  Service: Orthopedics;  Laterality: Left;       Home Medications    Prior to Admission medications   Medication Sig Start Date End Date Taking? Authorizing Provider  azithromycin (ZITHROMAX) 250 MG tablet Take 1 tablet (250 mg total) by mouth daily. Take first 2 tablets together, then 1 every day until finished. 03/10/17   Elvina Sidle, MD  HYDROcodone-homatropine (HYDROMET) 5-1.5 MG/5ML syrup Take 5 mLs by mouth every 6 (six) hours as needed for cough. 03/10/17   Elvina Sidle, MD  predniSONE (DELTASONE) 20 MG tablet Two daily with food 03/10/17   Elvina Sidle, MD    Family History History reviewed. No pertinent family history.  Social History Social History  Substance Use Topics  . Smoking status: Current Every Day Smoker    Packs/day: 0.50  . Smokeless tobacco: Never Used  . Alcohol use No     Allergies   No known allergies   Review of  Systems Review of Systems  Constitutional: Positive for chills and fever.  Respiratory: Positive for cough.   All other systems reviewed and are negative.    Physical Exam Triage Vital Signs ED Triage Vitals [03/10/17 1744]  Enc Vitals Group     BP 118/79     Pulse Rate (!) 109     Resp 18     Temp 99.1 F (37.3 C)     Temp Source Oral     SpO2 100 %     Weight      Height      Head Circumference      Peak Flow      Pain Score      Pain Loc      Pain Edu?      Excl. in GC?    No data found.   Updated Vital Signs BP 118/79   Pulse (!) 109   Temp 99.1 F (37.3 C) (Oral)   Resp 18   SpO2 100%    Physical Exam  Constitutional: He is oriented to person, place, and time. He appears well-developed and well-nourished.  HENT:  Right Ear: External ear normal.  Left Ear: External ear normal.  Pendulous uvula  Eyes: Conjunctivae are normal.  Neck: Normal range of motion. Neck supple.  Cardiovascular: Normal rate and regular rhythm.   Pulmonary/Chest: Effort normal. He has  rales.  Musculoskeletal: Normal range of motion.  Neurological: He is alert and oriented to person, place, and time.  Skin: Skin is warm and dry.  Nursing note and vitals reviewed.    UC Treatments / Results  Labs (all labs ordered are listed, but only abnormal results are displayed) Labs Reviewed - No data to display  EKG  EKG Interpretation None       Radiology No results found.  Procedures Procedures (including critical care time)  Medications Ordered in UC Medications - No data to display   Initial Impression / Assessment and Plan / UC Course  I have reviewed the triage vital signs and the nursing notes.  Pertinent labs & imaging results that were available during my care of the patient were reviewed by me and considered in my medical decision making (see chart for details).     Final Clinical Impressions(s) / UC Diagnoses   Final diagnoses:  Bronchitis    New  Prescriptions New Prescriptions   AZITHROMYCIN (ZITHROMAX) 250 MG TABLET    Take 1 tablet (250 mg total) by mouth daily. Take first 2 tablets together, then 1 every day until finished.   HYDROCODONE-HOMATROPINE (HYDROMET) 5-1.5 MG/5ML SYRUP    Take 5 mLs by mouth every 6 (six) hours as needed for cough.   PREDNISONE (DELTASONE) 20 MG TABLET    Two daily with food     Controlled Substance Prescriptions [place] Controlled Substance Registry consulted? Not Applicable   Elvina SidleLauenstein, Tajuan Dufault, MD 03/10/17 [phone]

## 2017-03-18 ENCOUNTER — Encounter (HOSPITAL_COMMUNITY): Payer: Self-pay | Admitting: Emergency Medicine

## 2017-03-18 ENCOUNTER — Emergency Department (HOSPITAL_COMMUNITY): Payer: 59

## 2017-03-18 ENCOUNTER — Emergency Department (HOSPITAL_COMMUNITY)
Admission: EM | Admit: 2017-03-18 | Discharge: 2017-03-18 | Disposition: A | Discharge status: Home / Self Care | Payer: 59 | Attending: Emergency Medicine | Admitting: Emergency Medicine

## 2017-03-18 DIAGNOSIS — K047 Periapical abscess without sinus: Secondary | ICD-10-CM | POA: Insufficient documentation

## 2017-03-18 DIAGNOSIS — L03211 Cellulitis of face: Secondary | ICD-10-CM | POA: Diagnosis not present

## 2017-03-18 DIAGNOSIS — F17210 Nicotine dependence, cigarettes, uncomplicated: Secondary | ICD-10-CM | POA: Insufficient documentation

## 2017-03-18 DIAGNOSIS — R05 Cough: Secondary | ICD-10-CM | POA: Insufficient documentation

## 2017-03-18 HISTORY — DX: Bronchitis, not specified as acute or chronic: J40

## 2017-03-18 LAB — CBC WITH DIFFERENTIAL/PLATELET
BASOS ABS: 0 10*3/uL (ref 0.0–0.1)
Basophils Relative: 0 %
Eosinophils Absolute: 0 10*3/uL (ref 0.0–0.7)
Eosinophils Relative: 0 %
HEMATOCRIT: 43.4 % (ref 39.0–52.0)
Hemoglobin: 15 g/dL (ref 13.0–17.0)
LYMPHS ABS: 4.4 10*3/uL — AB (ref 0.7–4.0)
LYMPHS PCT: 40 %
MCH: 33.5 pg (ref 26.0–34.0)
MCHC: 34.6 g/dL (ref 30.0–36.0)
MCV: 96.9 fL (ref 78.0–100.0)
MONO ABS: 1 10*3/uL (ref 0.1–1.0)
Monocytes Relative: 9 %
NEUTROS ABS: 5.5 10*3/uL (ref 1.7–7.7)
Neutrophils Relative %: 51 %
Platelets: 305 10*3/uL (ref 150–400)
RBC: 4.48 MIL/uL (ref 4.22–5.81)
RDW: 13.9 % (ref 11.5–15.5)
WBC: 10.9 10*3/uL — ABNORMAL HIGH (ref 4.0–10.5)

## 2017-03-18 LAB — COMPREHENSIVE METABOLIC PANEL
ALBUMIN: 3.4 g/dL — AB (ref 3.5–5.0)
ALT: 25 U/L (ref 17–63)
ANION GAP: 5 (ref 5–15)
AST: 23 U/L (ref 15–41)
Alkaline Phosphatase: 87 U/L (ref 38–126)
BUN: 9 mg/dL (ref 6–20)
CHLORIDE: 105 mmol/L (ref 101–111)
CO2: 30 mmol/L (ref 22–32)
Calcium: 8.9 mg/dL (ref 8.9–10.3)
Creatinine, Ser: 0.95 mg/dL (ref 0.61–1.24)
GFR calc Af Amer: >60 mL/min (ref >60)
GFR calc non Af Amer: >60 mL/min (ref >60)
GLUCOSE: 71 mg/dL (ref 65–99)
POTASSIUM: 3.7 mmol/L (ref 3.5–5.1)
SODIUM: 140 mmol/L (ref 135–145)
TOTAL PROTEIN: 7.4 g/dL (ref 6.5–8.1)
Total Bilirubin: 0.4 mg/dL (ref 0.3–1.2)

## 2017-03-18 LAB — URINALYSIS, ROUTINE W REFLEX MICROSCOPIC
Bilirubin Urine: NEGATIVE
Glucose, UA: NEGATIVE mg/dL
Ketones, ur: NEGATIVE mg/dL
LEUKOCYTES UA: NEGATIVE
NITRITE: NEGATIVE
PH: 7 (ref 5.0–8.0)
Protein, ur: NEGATIVE mg/dL
SPECIFIC GRAVITY, URINE: 1.023 (ref 1.005–1.030)
Squamous Epithelial / LPF: NONE SEEN

## 2017-03-18 LAB — CG4 I-STAT (LACTIC ACID): LACTIC ACID, VENOUS: 1 mmol/L (ref 0.5–1.9)

## 2017-03-18 MED ORDER — AMOXICILLIN-POT CLAVULANATE 875-125 MG PO TABS: 1.0000 | ORAL_TABLET | Freq: Two times a day (BID) | ORAL | 0 refills | Status: DC

## 2017-03-18 MED ORDER — IOPAMIDOL (ISOVUE-300) INJECTION 61%
75.0000 mL | Freq: Once | INTRAVENOUS | Status: AC | PRN
  Administered 2017-03-18: 75 mL via INTRAVENOUS

## 2017-03-18 MED ORDER — IOPAMIDOL (ISOVUE-300) INJECTION 61%
INTRAVENOUS | Status: AC
  Filled 2017-03-18: qty 75

## 2017-03-18 NOTE — ED Provider Notes (Signed)
[facility]-EMERGENCY DEPT Provider Note   CSN: [id number] Arrival date & time: 03/18/17  0946     History   Chief Complaint Chief Complaint  Patient presents with  . Facial Swelling  . Cough    HPI Walter Adams is a 56 y.o. male.  HPI  56 year old male presents with a chief complaint of left facial swelling. He states it started this morning. It has felt warm and goes from his lower face to around his left eye. No blurry vision. No trouble swallowing, drooling, or trouble speaking. No vomiting. He has had a cough for 2 weeks and went to urgent care and was given antibiotics, cough medicine. The note says he was given prednisone but he states he has not gotten any prednisone and is asking for steroids.He does not of any shortness of breath or fevers but continues to have a cough with green sputum. He has chronically poor teeth but denies any acute dental pain or swelling.  Past Medical History:  Diagnosis Date  . Bronchitis     Patient Active Problem List   Diagnosis Date Noted  . Bunion of great toe   . Claw toe, acquired, left     Past Surgical History:  Procedure Laterality Date  . HERNIA REPAIR    . WEIL OSTEOTOMY Left 09/27/2016   Procedure: Opening Wedge Osteotomy Base of the 1st Metatarsal Left Foot, Ostectomy of the Metatarsal Head with Aiken Osteotomy of the Proximal Phalanx Left Great Toe, and Weil Osteotomy 2nd and 3rd Metatarsal;  Surgeon: Nadara Mustard, MD;  Location: [facility] OR;  Service: Orthopedics;  Laterality: Left;       Home Medications    Prior to Admission medications   Medication Sig Start Date End Date Taking? Authorizing Provider  amoxicillin-clavulanate (AUGMENTIN) 875-125 MG tablet Take 1 tablet by mouth 2 (two) times daily. One po bid x 7 days 03/18/17   Pricilla Loveless, MD  azithromycin (ZITHROMAX) 250 MG tablet Take 1 tablet (250 mg total) by mouth daily. Take first 2 tablets together, then 1 every day until finished. 03/10/17   Elvina Sidle, MD    HYDROcodone-homatropine (HYDROMET) 5-1.5 MG/5ML syrup Take 5 mLs by mouth every 6 (six) hours as needed for cough. 03/10/17   Elvina Sidle, MD  predniSONE (DELTASONE) 20 MG tablet Two daily with food 03/10/17   Elvina Sidle, MD    Family History History reviewed. No pertinent family history.  Social History Social History  Substance Use Topics  . Smoking status: Current Every Day Smoker    Packs/day: 0.50  . Smokeless tobacco: Never Used  . Alcohol use No     Allergies   No known allergies   Review of Systems Review of Systems  Constitutional: Negative for fever.  HENT: Positive for dental problem and facial swelling. Negative for sore throat, trouble swallowing and voice change.   Respiratory: Positive for cough and wheezing. Negative for shortness of breath.   Cardiovascular: Negative for chest pain.  Gastrointestinal: Negative for vomiting.  All other systems reviewed and are negative.    Physical Exam Updated Vital Signs BP 121/87   Pulse 99   Temp 98.2 F (36.8 C)   Resp 14   SpO2 100%   Physical Exam  Constitutional: He is oriented to person, place, and time. He appears well-developed and well-nourished.  HENT:  Head: Atraumatic.    Right Ear: External ear normal.  Left Ear: External ear normal.  Nose: Nose normal.  Mouth/Throat: Uvula is midline. No  trismus in the jaw. No oropharyngeal exudate.  Overall poor dentition but no acute swelling, tenderness or abscess  Eyes: Pupils are equal, round, and reactive to light. EOM are normal. Right eye exhibits no discharge. Left eye exhibits no discharge.  Neck: Neck supple.  Cardiovascular: Normal rate, regular rhythm and normal heart sounds.   Pulmonary/Chest: Effort normal and breath sounds normal. He has no wheezes.  Abdominal: Soft. There is no tenderness.  Musculoskeletal: He exhibits no edema.  Neurological: He is alert and oriented to person, place, and time.  Skin: Skin is warm and dry.   Nursing note and vitals reviewed.    ED Treatments / Results  Labs (all labs ordered are listed, but only abnormal results are displayed) Labs Reviewed  COMPREHENSIVE METABOLIC PANEL - Abnormal; Notable for the following:       Result Value   Albumin 3.4 (*)    All other components within normal limits  CBC WITH DIFFERENTIAL/PLATELET - Abnormal; Notable for the following:    WBC 10.9 (*)    Lymphs Abs 4.4 (*)    All other components within normal limits  URINALYSIS, ROUTINE W REFLEX MICROSCOPIC - Abnormal; Notable for the following:    APPearance HAZY (*)    Hgb urine dipstick SMALL (*)    Bacteria, UA RARE (*)    All other components within normal limits  I-STAT CG4 LACTIC ACID, ED  CG4 I-STAT (LACTIC ACID)  I-STAT CG4 LACTIC ACID, ED    EKG  EKG Interpretation None       Radiology Dg Chest 2 View  Result Date: 03/18/2017 CLINICAL DATA:  Non productive cough x 2 weeks; no known cardiopulmonary problems; smoker; EXAM: CHEST  2 VIEW COMPARISON:  None. FINDINGS: The heart size and mediastinal contours are within normal limits. Both lungs are clear. No pleural effusion or pneumothorax. The visualized skeletal structures are unremarkable. IMPRESSION: No active cardiopulmonary disease. Electronically Signed   By: Amie Portland M.D.   On: 03/18/2017 10:40   Ct Maxillofacial W Contrast  Result Date: 03/18/2017 CLINICAL DATA:  Facial swelling and bronchitis. Swelling began last night. Left eye with swollen shut this morning. EXAM: CT MAXILLOFACIAL WITH CONTRAST TECHNIQUE: Multidetector CT imaging of the maxillofacial structures was performed with intravenous contrast. Multiplanar CT image reconstructions were also generated. CONTRAST:  52mL ISOVUE-300 IOPAMIDOL (ISOVUE-300) INJECTION 61% COMPARISON:  None. FINDINGS: Osseous: Negative for fracture or destructive process. There is advanced dental disease with large cavities in multiple of the remaining teeth. Multiple periapical  erosions in the bilateral maxilla where there is also osteitis changes. The eleventh tooth is notable for a large periapical erosion with neighboring subperiosteal rim enhancing low density measuring 7 mm. This is likely the source of the patient's left face swelling, which is centered around this area. Orbits: Negative for postseptal swelling.  No masslike findings. Sinuses: Sinusitis with mucosal thickening in the bilateral maxillary antra; there is also fluid levels. Mucosal thickening narrows the left more than right maxillary infundibula. Sinusitis changes are milder elsewhere. Minimal leftward septal spurring. Soft tissues: Left face swelling as noted above. No soft tissue emphysema. No venous occlusion. Limited intracranial: Negative IMPRESSION: 1. Odontogenic infection in the left face with 7 mm subperiosteal abscess along tooth 11. Dental disease is widespread and advanced. 2. Bilateral maxillary sinusitis. Electronically Signed   By: Marnee Spring M.D.   On: 03/18/2017 13:26    Procedures Procedures (including critical care time)  Medications Ordered in ED Medications  iopamidol (ISOVUE-300) 61 %  injection 75 mL (75 mLs Intravenous Contrast Given 03/18/17 1308)     Initial Impression / Assessment and Plan / ED Course  I have reviewed the triage vital signs and the nursing notes.  Pertinent labs & imaging results that were available during my care of the patient were reviewed by me and considered in my medical decision making (see chart for details).     CT shows what is likely cellulitis from a dental infection. On reevaluation there does appear to be a small amount of pus over his maxillary tooth. Given it is early draining I don't think incision is needed.  Overall he appears well and has no pain.his chronic cough is likely left over from his previous bronchitis but no current pneumonia. Treat his dental infection with antibiotics and discuss need for follow-up with an oral surgeon  for his acute on chronic dental issues. Discussed return precautions.  Final Clinical Impressions(s) / ED Diagnoses   Final diagnoses:  Facial cellulitis  Dental abscess    New Prescriptions Discharge Medication List as of 03/18/2017  1:42 PM    START taking these medications   Details  amoxicillin-clavulanate (AUGMENTIN) 875-125 MG tablet Take 1 tablet by mouth 2 (two) times daily. One po bid x 7 days, Starting Sun 03/18/2017, Print         Pricilla Loveless, MD 03/18/17 [phone]

## 2017-03-18 NOTE — ED Notes (Signed)
Patient was alert, oriented and stable upon discharge. RN went over AVS and patient had no further questions.  

## 2017-03-18 NOTE — ED Triage Notes (Signed)
Pt c/o facial swelling and bronchitis. Pt states he developed facial swelling last night. Pt with L facial swelling up to eye and patient reports that eye was swollen closed after sleeping. Face is pink and warm on L side. Denies discharge from eye. Pt had been treated for bronchitis 2 weeks ago and completed antibiotics but cough remains.

## 2017-03-18 NOTE — ED Notes (Signed)
ED Provider at bedside. 

## 2018-02-09 ENCOUNTER — Encounter (HOSPITAL_COMMUNITY): Payer: Self-pay | Admitting: Emergency Medicine

## 2018-02-09 ENCOUNTER — Other Ambulatory Visit: Payer: Self-pay

## 2018-02-09 ENCOUNTER — Emergency Department (HOSPITAL_COMMUNITY)
Admission: EM | Admit: 2018-02-09 | Discharge: 2018-02-09 | Disposition: A | Discharge status: Home / Self Care | Payer: Self-pay | Attending: Emergency Medicine | Admitting: Emergency Medicine

## 2018-02-09 DIAGNOSIS — L0291 Cutaneous abscess, unspecified: Secondary | ICD-10-CM

## 2018-02-09 DIAGNOSIS — L02213 Cutaneous abscess of chest wall: Secondary | ICD-10-CM | POA: Insufficient documentation

## 2018-02-09 DIAGNOSIS — F17210 Nicotine dependence, cigarettes, uncomplicated: Secondary | ICD-10-CM | POA: Insufficient documentation

## 2018-02-09 MED ORDER — SULFAMETHOXAZOLE-TRIMETHOPRIM 800-160 MG PO TABS: 1.0000 | ORAL_TABLET | Freq: Two times a day (BID) | ORAL | 0 refills | Status: AC

## 2018-02-09 NOTE — ED Triage Notes (Signed)
Pt st's he noticed a small abscess under right arm earlier tonight.  Pt denies any drainage

## 2018-02-09 NOTE — Discharge Instructions (Addendum)
Warm compresses to area for 20 minutes at a time. Take antibiotic as prescribed and complete the full course. Recheck with PCP or urgent care in 2 days, return to ER for worsening or concerning symptoms.

## 2018-02-09 NOTE — ED Provider Notes (Signed)
[facility]   CSN: [id number] Arrival date & time: 02/09/18  2146     History   Chief Complaint Chief Complaint  Patient presents with  . Abscess    HPI Walter Adams is a 57 y.o. male.  57 yo male presents with abscess to the right mid axillary chest wall noticed tonight, slightly tender, no drainage, no previous abscess history, non diabetic. No other complaints or concerns.      Past Medical History:  Diagnosis Date  . Bronchitis     Patient Active Problem List   Diagnosis Date Noted  . Bunion of great toe   . Claw toe, acquired, left     Past Surgical History:  Procedure Laterality Date  . HERNIA REPAIR    . WEIL OSTEOTOMY Left 09/27/2016   Procedure: Opening Wedge Osteotomy Base of the 1st Metatarsal Left Foot, Ostectomy of the Metatarsal Head with Aiken Osteotomy of the Proximal Phalanx Left Great Toe, and Weil Osteotomy 2nd and 3rd Metatarsal;  Surgeon: Nadara MustardMarcus Duda V, MD;  Location: [facility] OR;  Service: Orthopedics;  Laterality: Left;        Home Medications    Prior to Admission medications   Medication Sig Start Date End Date Taking? Authorizing Provider  sulfamethoxazole-trimethoprim (BACTRIM DS,SEPTRA DS) 800-160 MG tablet Take 1 tablet by mouth 2 (two) times daily for 10 days. 02/09/18 02/19/18  Jeannie FendMurphy, Cotey Rakes A, PA-C    Family History No family history on file.  Social History Social History   Tobacco Use  . Smoking status: Current Every Day Smoker    Packs/day: 0.50  . Smokeless tobacco: Never Used  Substance Use Topics  . Alcohol use: No  . Drug use: No     Allergies   No known allergies   Review of Systems Review of Systems  Constitutional: Negative for fever.  Musculoskeletal: Negative for back pain.  Skin: Negative for rash and wound.  Allergic/Immunologic: Negative for immunocompromised state.  Hematological: Negative for adenopathy.  All other systems reviewed and are  negative.    Physical Exam Updated Vital Signs BP 113/85 (BP Location: Right Arm)   Pulse 97   Temp 98 F (36.7 C) (Oral)   Resp 16   Ht 5\' 6"  (1.676 m)   Wt 73.9 kg (163 lb)   SpO2 100%   BMI 26.31 kg/m   Physical Exam  Constitutional: He is oriented to person, place, and time. He appears well-developed and well-nourished. No distress.  HENT:  Head: Normocephalic and atraumatic.  Pulmonary/Chest: Effort normal.  Neurological: He is alert and oriented to person, place, and time.  Skin: Skin is warm and dry. He is not diaphoretic.     Psychiatric: He has a normal mood and affect. His behavior is normal.  Nursing note and vitals reviewed.    ED Treatments / Results  Labs (all labs ordered are listed, but only abnormal results are displayed) Labs Reviewed - No data to display  EKG None  Radiology No results found.  Procedures .Marland Kitchen.Incision and Drainage Date/Time: 02/09/2018 11:09 PM Performed by: Jeannie FendMurphy, Khori Underberg A, PA-C Authorized by: Jeannie FendMurphy, Roczen Waymire A, PA-C   Consent:    Consent obtained:  Verbal   Consent given by:  Patient   Risks discussed:  Bleeding, incomplete drainage, infection and pain   Alternatives discussed:  No treatment Location:    Type:  Abscess   Size:  1cm x 1cm   Location:  Trunk   Trunk location:  Chest Pre-procedure details:    Skin preparation:  Antiseptic wash Anesthesia (see MAR for exact dosages):    Anesthesia method:  None Procedure type:    Complexity:  Simple Procedure details:    Needle aspiration: yes     Needle size:  18 G   Drainage:  Purulent   Drainage amount:  Scant   Wound treatment:  Wound left open   Packing materials:  None Post-procedure details:    Patient tolerance of procedure:  Tolerated well, no immediate complications Comments:     Area aspirated/opened with 18g needle, purulent material expressed   (including critical care time)  Medications Ordered in ED Medications - No data to display   Initial  Impression / Assessment and Plan / ED Course  I have reviewed the triage vital signs and the nursing notes.  Pertinent labs & imaging results that were available during my care of the patient were reviewed by me and considered in my medical decision making (see chart for details).  Clinical Course as of Feb 09 2310  Sat Feb 09, 2018  5422 57 year old nondiabetic male presents with right mid axillary nodule that he noticed this evening and was told it may be an abscess.  No prior abscess.  On exam 1 cm x 1 cm mildly erythematous, soft, tender nodule in the right mid axillary chest.  I aspirated with 18-gauge needle and the removed with 18-gauge needle, purulent material expressed, no further treatment needed at this time.  Discussed possible sebaceous cyst and recommend patient follow-up with PCP if area persists or becomes reinfected after time.  Apply warm compresses to the area, taking meds as prescribed, recheck with PCP (referral given) or see urgent care in 2 days for recheck, return to ER for worsening or concerning symptoms.   [LM]    Clinical Course User Index [LM] Jeannie Fend, PA-C    Final Clinical Impressions(s) / ED Diagnoses   Final diagnoses:  Abscess    ED Discharge Orders        Ordered    sulfamethoxazole-trimethoprim (BACTRIM DS,SEPTRA DS) 800-160 MG tablet  2 times daily     02/09/18 2236       Jeannie Fend, PA-C 02/09/18 2312    Gerhard Munch, MD 02/09/18 [phone]

## 2018-04-14 ENCOUNTER — Ambulatory Visit (HOSPITAL_COMMUNITY)
Admission: EM | Admit: 2018-04-14 | Discharge: 2018-04-14 | Disposition: A | Discharge status: Home / Self Care | Payer: Self-pay | Attending: Internal Medicine | Admitting: Internal Medicine

## 2018-04-14 ENCOUNTER — Encounter (HOSPITAL_COMMUNITY): Payer: Self-pay | Admitting: Emergency Medicine

## 2018-04-14 ENCOUNTER — Other Ambulatory Visit: Payer: Self-pay

## 2018-04-14 DIAGNOSIS — R079 Chest pain, unspecified: Secondary | ICD-10-CM

## 2018-04-14 DIAGNOSIS — M546 Pain in thoracic spine: Secondary | ICD-10-CM

## 2018-04-14 DIAGNOSIS — M62830 Muscle spasm of back: Secondary | ICD-10-CM

## 2018-04-14 MED ORDER — CYCLOBENZAPRINE HCL 10 MG PO TABS: ORAL_TABLET | ORAL | 0 refills | Status: DC

## 2018-04-14 MED ORDER — NAPROXEN 500 MG PO TABS: 500.0000 mg | ORAL_TABLET | Freq: Two times a day (BID) | ORAL | 0 refills | Status: DC | PRN

## 2018-04-14 NOTE — ED Provider Notes (Signed)
[facility]    CSN: [id number] Arrival date & time: 04/14/18  1449     History   Chief Complaint Chief Complaint  Patient presents with  . Chest Pain    HPI Walter Adams is a 57 y.o. male.   57 year old male accompanied by his girlfriend with concern over chest pain that has changed to upper and mid-thoracic back pain over the past 8 hours. Pain started after waiting and sleeping in the chairs in the ER for about 11 hours. His girlfriend was evaluated and treated for migraine headache in the ER but they were both there almost 12 hours. Started experiencing more intermittent upper epigastric, lower central chest pain around 8am this morning. They went to [facility] after being in the ER and had a full breakfast. Pain has decreased in intensity this afternoon but pain moved to his right and left mid thoracic to lower back area about 2 hours ago. Tender in area and pain increases with movement. Denies any fever, headache, vision changes, diaphoresis, radiating of pain down extremities, difficulty breathing, abdominal pain, nausea, vomiting or diarrhea. He has not taken anything for pain. He does smoke cigarettes daily but no alcohol or illicit drug use. No history of HTN or DM. No chronic health issues. Takes no daily medication.   The history is provided by the patient and a significant other.    Past Medical History:  Diagnosis Date  . Bronchitis     Patient Active Problem List   Diagnosis Date Noted  . Bunion of great toe   . Claw toe, acquired, left     Past Surgical History:  Procedure Laterality Date  . HERNIA REPAIR    . WEIL OSTEOTOMY Left 09/27/2016   Procedure: Opening Wedge Osteotomy Base of the 1st Metatarsal Left Foot, Ostectomy of the Metatarsal Head with Aiken Osteotomy of the Proximal Phalanx Left Great Toe, and Weil Osteotomy 2nd and 3rd Metatarsal;  Surgeon: Nadara Mustard, MD;  Location: [facility];  Service: Orthopedics;  Laterality: Left;        Home Medications    Prior to Admission medications   Medication Sig Start Date End Date Taking? Authorizing Provider  cyclobenzaprine (FLEXERIL) 10 MG tablet Take 1/2 to 1 whole tablet by mouth every 8 hours as needed for muscle spasms/pain. 04/14/18   Sudie Grumbling, NP  naproxen (NAPROSYN) 500 MG tablet Take 1 tablet (500 mg total) by mouth 2 (two) times daily as needed for moderate pain. 04/14/18   Sudie Grumbling, NP    Family History History reviewed. No pertinent family history.  Social History Social History   Tobacco Use  . Smoking status: Current Every Day Smoker    Packs/day: 0.50  . Smokeless tobacco: Never Used  Substance Use Topics  . Alcohol use: No  . Drug use: No     Allergies   No known allergies   Review of Systems Review of Systems  Constitutional: Negative for activity change, appetite change, chills, diaphoresis, fatigue, fever and unexpected weight change.  HENT: Negative for congestion, mouth sores, sore throat and trouble swallowing.   Eyes: Negative for photophobia and visual disturbance.  Respiratory: Negative for cough, chest tightness, shortness of breath, wheezing and stridor.   Cardiovascular: Positive for chest pain (lower central). Negative for palpitations and leg swelling.  Gastrointestinal: Positive for abdominal pain (upper ). Negative for blood in stool, constipation, nausea and vomiting.  Genitourinary: Negative for decreased urine volume, difficulty urinating, dysuria,  flank pain, frequency, hematuria and urgency.  Musculoskeletal: Positive for back pain and myalgias. Negative for arthralgias, neck pain and neck stiffness.  Skin: Negative for color change, rash and wound.  Allergic/Immunologic: Negative for immunocompromised state.  Neurological: Negative for dizziness, tremors, seizures, syncope, facial asymmetry, speech difficulty, weakness, light-headedness, numbness and headaches.  Hematological: Negative for adenopathy.  Does not bruise/bleed easily.  Psychiatric/Behavioral: Negative.      Physical Exam Triage Vital Signs ED Triage Vitals  Enc Vitals Group     BP 04/14/18 1520 106/69     Pulse Rate 04/14/18 1520 90     Resp --      Temp 04/14/18 1520 98.2 F (36.8 C)     Temp Source 04/14/18 1520 Oral     SpO2 04/14/18 1520 100 %     Weight --      Height --      Head Circumference --      Peak Flow --      Pain Score 04/14/18 1523 10     Pain Loc --      Pain Edu? --      Excl. in GC? --    No data found.  Updated Vital Signs BP 106/69 (BP Location: Left Arm)   Pulse 90   Temp 98.2 F (36.8 C) (Oral)   SpO2 100%   Visual Acuity Right Eye Distance:   Left Eye Distance:   Bilateral Distance:    Right Eye Near:   Left Eye Near:    Bilateral Near:     Physical Exam  Constitutional: He is oriented to person, place, and time. Vital signs are normal. He appears well-developed and well-nourished. He is cooperative. He does not appear ill. No distress.  Patient lying down on exam table in no acute distress. Pain increases in mid back with changing positions but no longer in pain in chest.   HENT:  Head: Normocephalic and atraumatic.  Right Ear: Hearing and external ear normal.  Left Ear: Hearing and external ear normal.  Nose: Nose normal.  Eyes: Pupils are equal, round, and reactive to light. Conjunctivae and EOM are normal.  Neck: Normal range of motion. Neck supple. Normal carotid pulses and no JVD present. Carotid bruit is not present.  Cardiovascular: Normal rate, regular rhythm, normal heart sounds, intact distal pulses and normal pulses.  No murmur heard. Pulmonary/Chest: Effort normal and breath sounds normal. No stridor. No respiratory distress. He has no decreased breath sounds. He has no wheezes. He has no rhonchi. He has no rales. He exhibits tenderness. He exhibits no mass, no edema, no swelling and no retraction.  Abdominal: Soft. Normal appearance and bowel sounds are  normal. He exhibits no distension, no fluid wave, no abdominal bruit and no mass. There is no hepatosplenomegaly. There is no tenderness. There is no rigidity, no rebound, no guarding and no CVA tenderness.    Location of earlier chest pain- no longer painful or tender  Musculoskeletal: Normal range of motion. He exhibits tenderness.       Thoracic back: He exhibits tenderness, pain and spasm. He exhibits normal range of motion, no swelling, no edema and normal pulse.       Back:  Has full range of motion of back but pain with flexion and rotation of thoracic muscles. Tender on right and left Latissimus dorsi muscle group with spasms present. No distinct swelling, redness or rash. No pain with movement of shoulders. No numbness or neuro deficits noted.  Lymphadenopathy:    He has no cervical adenopathy.  Neurological: He is alert and oriented to person, place, and time. He has normal strength and normal reflexes. No sensory deficit.  Skin: Skin is warm and dry. Capillary refill takes less than 2 seconds. No rash noted.  Psychiatric: He has a normal mood and affect. His behavior is normal. Judgment and thought content normal.  Vitals reviewed.    UC Treatments / Results  Labs (all labs ordered are listed, but only abnormal results are displayed) Labs Reviewed - No data to display  EKG None  Radiology No results found.  Procedures ED EKG Date/Time: 04/14/2018 4:20 PM Performed by: Sudie Grumbling, NP Authorized by: Isa Rankin, MD   ECG reviewed by ED Physician in the absence of a cardiologist: no   Previous ECG:    Previous ECG:  Unavailable (no previous ECG) Interpretation:    Interpretation: normal   Rate:    ECG rate:  94   ECG rate assessment: normal   Rhythm:    Rhythm: sinus rhythm   Ectopy:    Ectopy: none   QRS:    QRS axis:  Normal Conduction:    Conduction: normal   ST segments:    ST segments:  Normal T waves:    T waves: normal   Comments:      Also reviewed by another Urgent Care provider Lowanda Foster [place], [place]) - no distinct abnormalities detected.    (including critical care time)  Medications Ordered in UC Medications - No data to display  Initial Impression / Assessment and Plan / UC Course  I have reviewed the triage vital signs and the nursing notes.  Pertinent labs & imaging results that were available during my care of the patient were reviewed by me and considered in my medical decision making (see chart for details).    Reviewed EKG with patient and girlfriend- no distinct abnormalities detected. Discussed that chest pain may be related to GERD. Since improving and no other concerning symptoms, do not feel pain is cardiac in nature. But continue to monitor. Back pain appears to be due to muscle strain (possibly from sitting in unusual position for almost 12 hours). Recommend trial Naproxen 500mg  twice a day as directed. May use Flexeril 10mg - 1/2 to 1 whole tablet every 8 hours as needed for muscle spasms. Apply heat to area for comfort. If any return of chest pain, or any difficulty breathing, nausea, radiation of pain, or dizziness occurs, go to the ER ASAP. Otherwise follow-up with a PCP in 3 to 4 days if not improving.    Final Clinical Impressions(s) / UC Diagnoses   Final diagnoses:  Acute bilateral thoracic back pain  Muscle spasm of back  Chest pain, unspecified type     Discharge Instructions     Recommend start Naproxen 500mg  twice a day as directed for pain. May take Flexeril muscle relaxer- 1/2 tablet up to 1 whole tablet every 8 hours as needed for muscle spasms. Apply warm heat to area for comfort. If any return of chest pain, difficulty breathing, nausea, radiation of pain, or dizziness occurs, go to the ER ASAP. Otherwise follow-up with a PCP in 3 to 4 days if not improving.     ED Prescriptions    Medication Sig Dispense Auth. Provider   naproxen (NAPROSYN) 500 MG tablet Take 1 tablet (500 mg  total) by mouth 2 (two) times daily as needed for moderate pain. 20 tablet Sudie Grumbling,  NP   cyclobenzaprine (FLEXERIL) 10 MG tablet Take 1/2 to 1 whole tablet by mouth every 8 hours as needed for muscle spasms/pain. 15 tablet Sudie Grumbling, NP     Controlled Substance Prescriptions [place] Controlled Substance Registry consulted? Not Applicable   Sudie Grumbling, NP 04/15/18 1126

## 2018-04-14 NOTE — ED Triage Notes (Signed)
Pt reports being in the ED with his friend until 0400 today.  He reports having lower central chest tightness around 0830 this morning.  He states the pain has eased off, but now he has mid/upper back pain.   Pt has pain with palpation in both the chest and the back.

## 2018-04-14 NOTE — Discharge Instructions (Addendum)
Recommend start Naproxen 500mg  twice a day as directed for pain. May take Flexeril muscle relaxer- 1/2 tablet up to 1 whole tablet every 8 hours as needed for muscle spasms. Apply warm heat to area for comfort. If any return of chest pain, difficulty breathing, nausea, radiation of pain, or dizziness occurs, go to the ER ASAP. Otherwise follow-up with a PCP in 3 to 4 days if not improving.

## 2019-06-09 IMAGING — CT CT MAXILLOFACIAL W/ CM
3 series · 15 of 47 positions shown, 18 images · IV contrast (iopamidol)
Comparison: None.

CLINICAL DATA: Facial swelling and bronchitis. Swelling began last
night. Left eye with swollen shut this morning.

EXAM:
CT MAXILLOFACIAL WITH CONTRAST
TECHNIQUE: Multidetector CT imaging of the maxillofacial structures was
performed with intravenous contrast. Multiplanar CT image
reconstructions were also generated.
CONTRAST:  75mL BW6KQT-7PP IOPAMIDOL (BW6KQT-7PP) INJECTION 61%

[Series 2: facial st · axial · 0.31mm/px · z∈[-188,-50]mm · 9 of 81 slices shown, 12 images]
[im 6/81  brain]
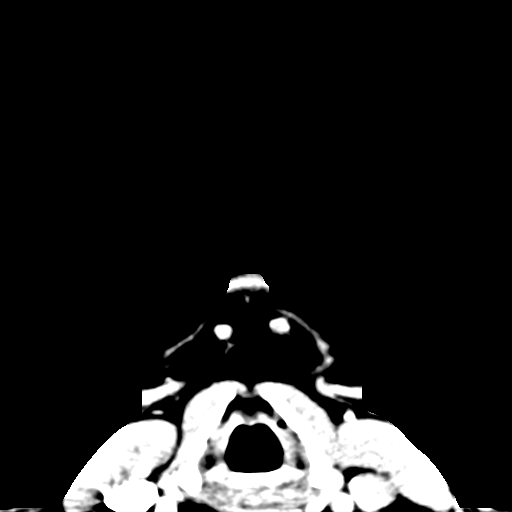
[im 6/81  bone]
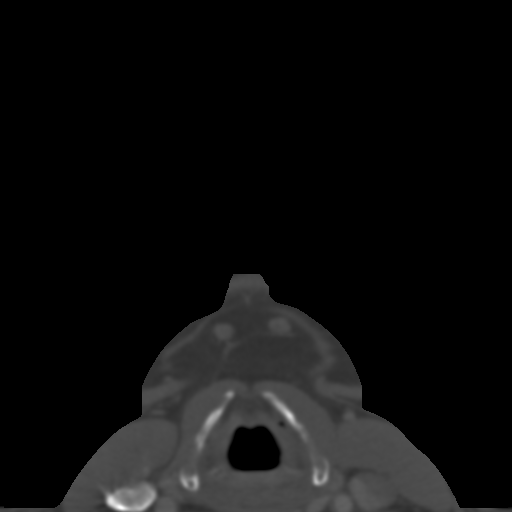
[im 14/81  bone]
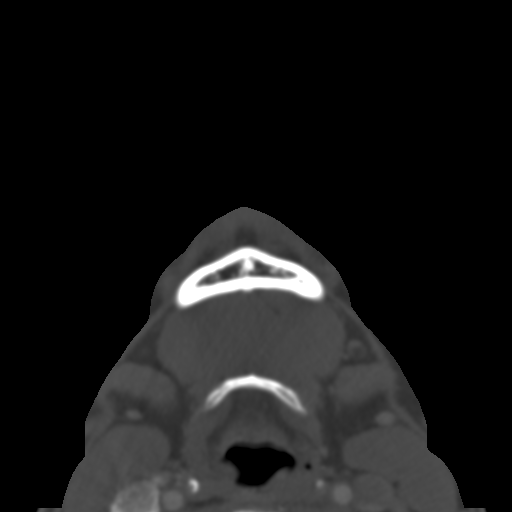
[im 23/81  bone]
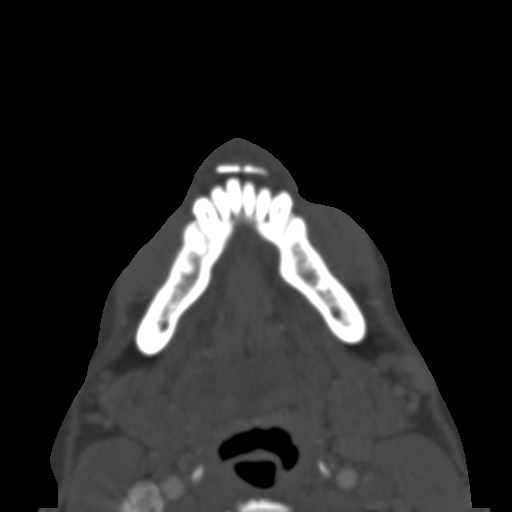
[im 31/81  bone]
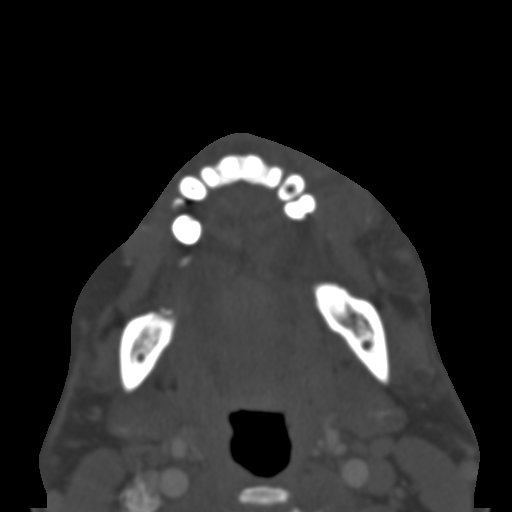
[im 42/81  brain]
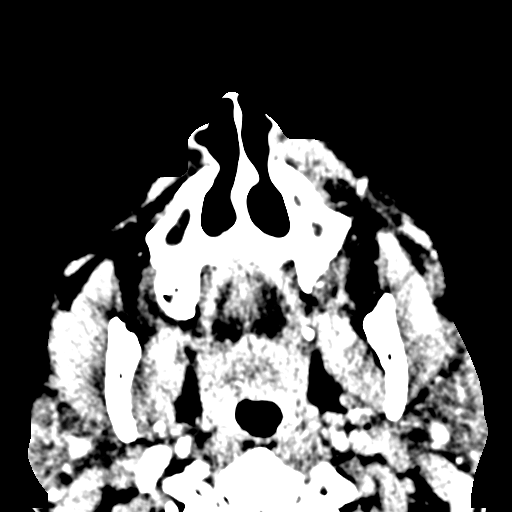
[im 42/81  bone]
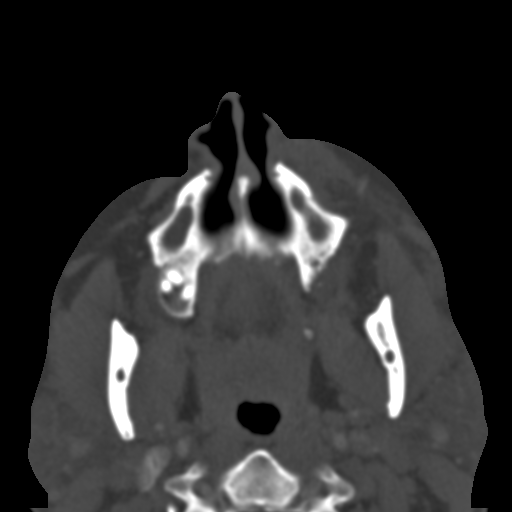
[im 50/81  bone]
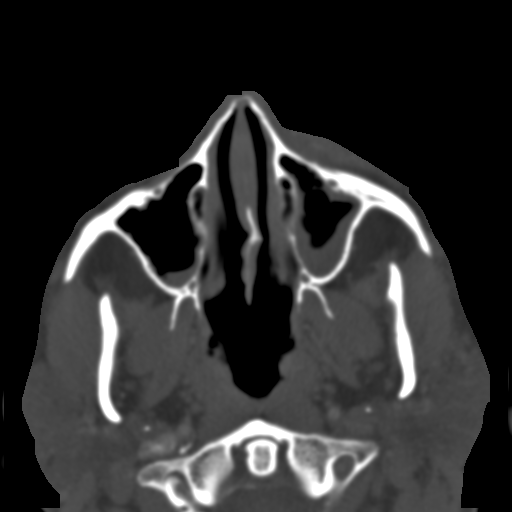
[im 58/81  bone]
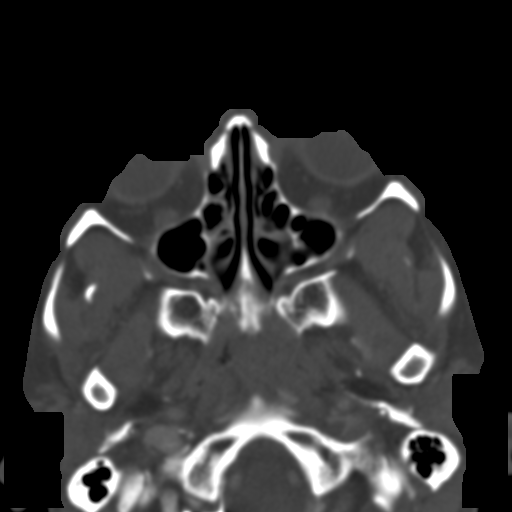
[im 67/81  bone]
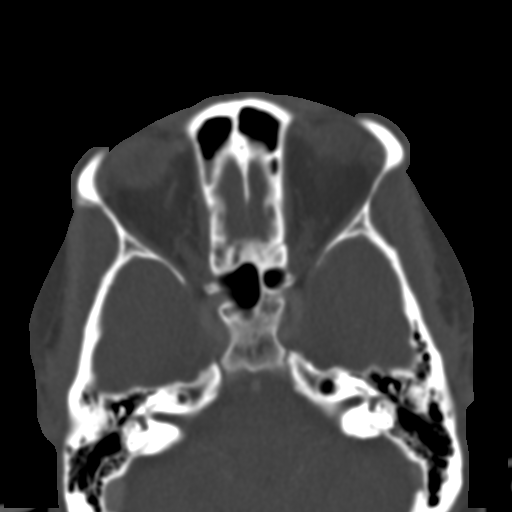
[im 75/81  brain]
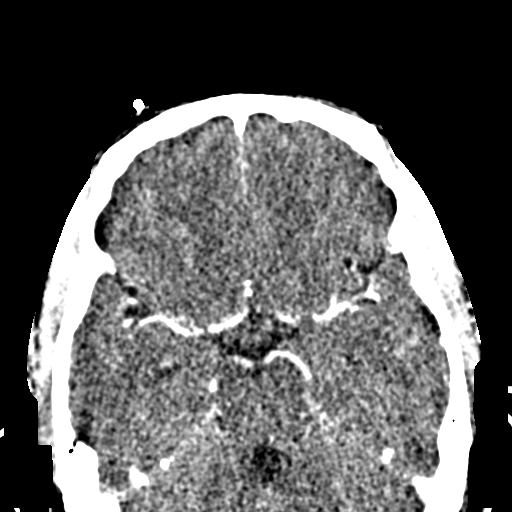
[im 75/81  bone]
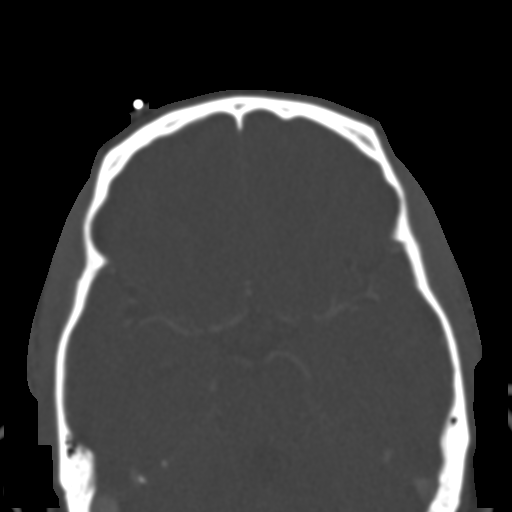

[Series 4: coronal st · coronal · 0.31mm/px · 3 of 103 slices shown]
[im 35/103  bone]
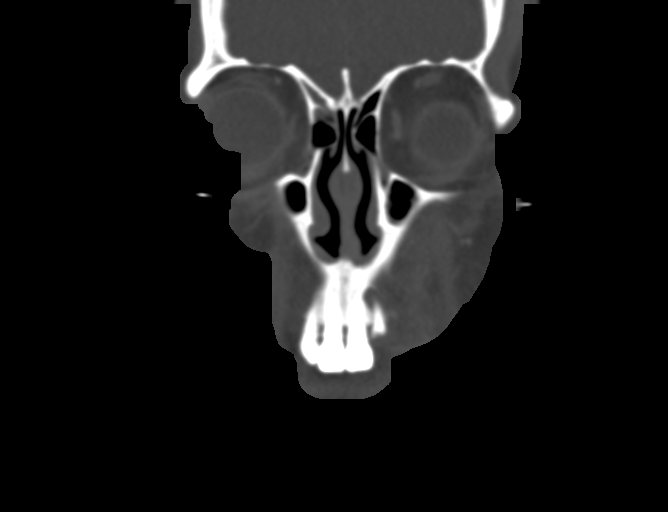
[im 46/103  bone]
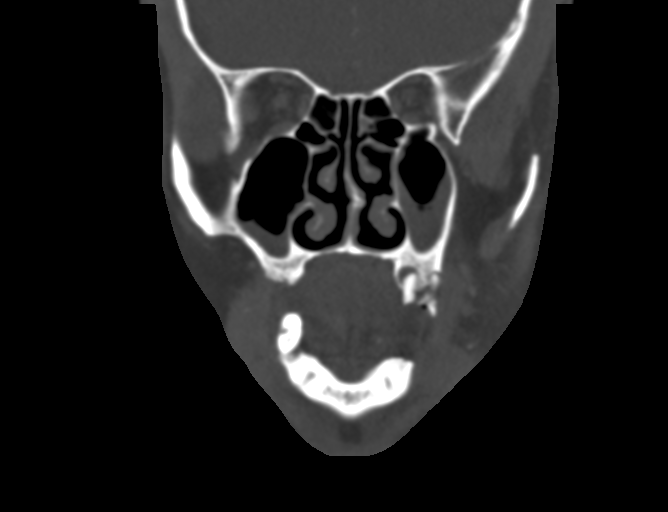
[im 57/103  bone]
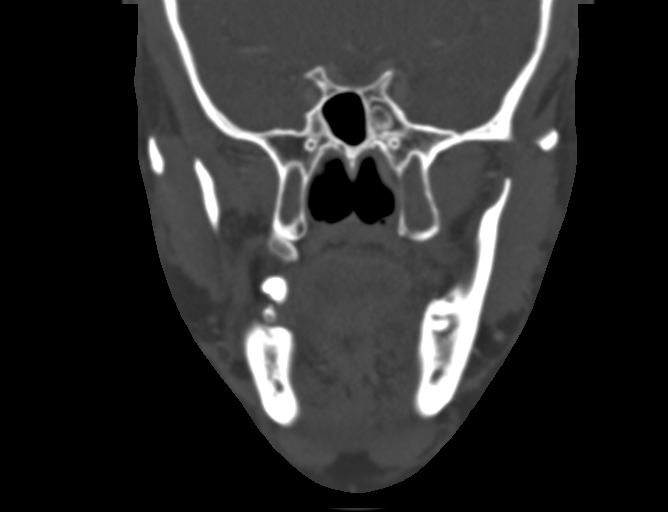

[Series 5: sagittal st · sagittal · 0.31mm/px · 3 of 86 slices shown]
[im 29/86  bone]
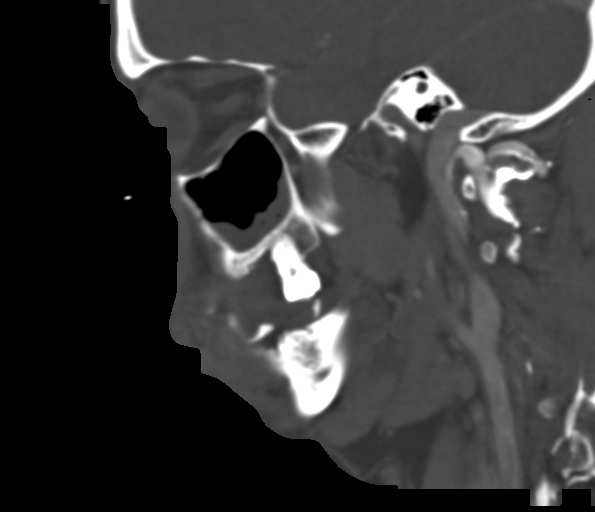
[im 43/86  bone]
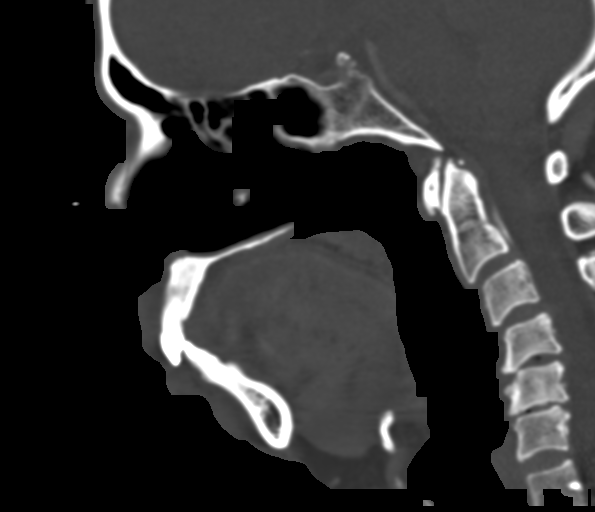
[im 57/86  bone]
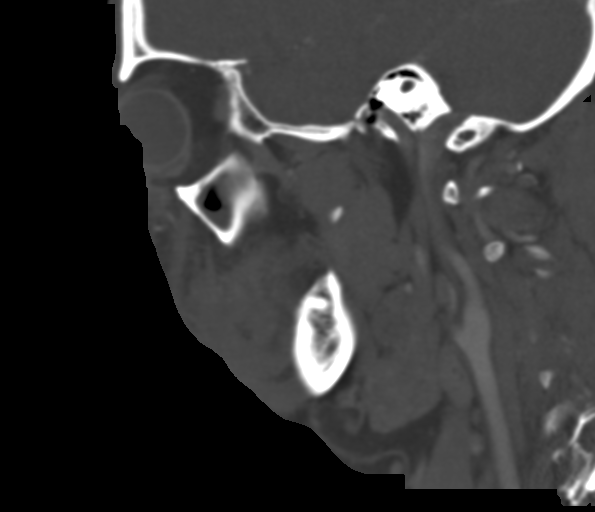

[15 of 47 positions shown; findings below may reference images not displayed]

FINDINGS: Osseous: Negative for fracture or destructive process. There is
advanced dental disease with large cavities in multiple of the
remaining teeth. Multiple periapical erosions in the bilateral
maxilla where there is also osteitis changes. The eleventh tooth is
notable for a large periapical erosion with neighboring
subperiosteal rim enhancing low density measuring 7 mm. This is
likely the source of the patient's left face swelling, which is
centered around this area.

Orbits: Negative for postseptal swelling.  No masslike findings.

Sinuses: Sinusitis with mucosal thickening in the bilateral
maxillary antra; there is also fluid levels. Mucosal thickening
narrows the left more than right maxillary infundibula. Sinusitis
changes are milder elsewhere. Minimal leftward septal spurring.

Soft tissues: Left face swelling as noted above. No soft tissue
emphysema. No venous occlusion.

Limited intracranial: Negative
IMPRESSION: 1. Odontogenic infection in the left face with 7 mm subperiosteal
abscess along tooth 11. Dental disease is widespread and advanced.
2. Bilateral maxillary sinusitis.

## 2020-02-25 ENCOUNTER — Other Ambulatory Visit: Payer: Self-pay

## 2020-02-25 ENCOUNTER — Ambulatory Visit (HOSPITAL_COMMUNITY)
Admission: EM | Admit: 2020-02-25 | Discharge: 2020-02-25 | Disposition: A | Discharge status: Home / Self Care | Payer: Self-pay | Attending: Family Medicine | Admitting: Family Medicine

## 2020-02-25 ENCOUNTER — Encounter (HOSPITAL_COMMUNITY): Payer: Self-pay | Admitting: Emergency Medicine

## 2020-02-25 DIAGNOSIS — M654 Radial styloid tenosynovitis [de Quervain]: Secondary | ICD-10-CM

## 2020-02-25 MED ORDER — PREDNISONE 10 MG (21) PO TBPK: ORAL_TABLET | Freq: Every day | ORAL | 0 refills | Status: DC

## 2020-02-25 NOTE — ED Notes (Signed)
Patient's wife called and states "His arm and hand is hurting and he needs an x ray and a doctor's note to return to work"  Explained to wife I will relay message to provider.

## 2020-02-25 NOTE — ED Provider Notes (Signed)
[facility]   [id number] 02/25/20 Arrival Time: 1129  ASSESSMENT & PLAN:  1. De Quervain's tenosynovitis, right     No indication for imaging at this time.  Begin: Meds ordered this encounter  Medications  . predniSONE (STERAPRED UNI-PAK 21 TAB) 10 MG (21) TBPK tablet    Sig: Take by mouth daily. Take as directed.    Dispense:  21 tablet    Refill:  0    Orders Placed This Encounter  Procedures  . Apply Thumb spica    Recommend:  Follow-up Information    Schedule an appointment as soon as possible for a visit  with [facility].   Contact information: [address] [place] [address] [phone]              Work note x 2 provided.  Reviewed expectations re: course of current medical issues. Questions answered. Outlined signs and symptoms indicating need for more acute intervention. Patient verbalized understanding. After Visit Summary given.  SUBJECTIVE: History from: patient. Walter Adams. is a 59 y.o. male who reports fairly persistent moderate pain of his right radial wrist/proximal thumb; described as aching and with occasional sharp pain; without radiation. Onset: gradual. Unsure of duration. Injury/trama: no. Does wash dishes at work; repetitive motions. Symptoms have gradually worsened since beginning. Aggravating factors: certain movements. Alleviating factors: have not been identified. Associated symptoms: none reported. Extremity sensation changes or weakness: none. Self treatment: has not tried OTC therapies.  History of similar: no.  Past Surgical History:  Procedure Laterality Date  . HERNIA REPAIR    . WEIL OSTEOTOMY Left 09/27/2016   Procedure: Opening Wedge Osteotomy Base of the 1st Metatarsal Left Foot, Ostectomy of the Metatarsal Head with Aiken Osteotomy of the Proximal Phalanx Left Great Toe, and Weil Osteotomy 2nd and 3rd Metatarsal;  Surgeon: Nadara Mustard,  MD;  Location: [facility];  Service: Orthopedics;  Laterality: Left;      OBJECTIVE:  Vitals:   02/25/20 1323  BP: 117/80  Pulse: 74  Resp: 18  Temp: 97.9 F (36.6 C)  TempSrc: Oral  SpO2: 99%    General appearance: alert; no distress HEENT: [place]; AT Neck: supple with FROM Resp: unlabored respirations Extremities: . RUE: warm with well perfused appearance; pain reported over radial side of wrist, more notable with thumb and wrist movement; no erythema or inflammation; no swelling; FROM; very tender over radial styloid CV: brisk extremity capillary refill of RUE; 2+ radial pulse of RUE. Skin: warm and dry; no visible rashes Neurologic: gait normal; normal sensation and strength of RUE Psychological: alert and cooperative; normal mood and affect    Allergies  Allergen Reactions  . No Known Allergies     Past Medical History:  Diagnosis Date  . Bronchitis    Social History   Socioeconomic History  . Marital status: Single    Spouse name: Not on file  . Number of children: Not on file  . Years of education: Not on file  . Highest education level: Not on file  Occupational History  . Not on file  Tobacco Use  . Smoking status: Current Every Day Smoker    Packs/day: 0.50  . Smokeless tobacco: Never Used  Vaping Use  . Vaping Use: Never used  Substance and Sexual Activity  . Alcohol use: No  . Drug use: No  . Sexual activity: Not on file  Other Topics Concern  . Not on file  Social History Narrative  . Not on file   Social Determinants of Health   Financial Resource Strain:   . Difficulty of Paying Living Expenses:   Food Insecurity:   . Worried About Programme researcher, broadcasting/film/video in the Last Year:   . Barista in the Last Year:   Transportation Needs:   . Freight forwarder (Medical):   Marland Kitchen Lack of Transportation (Non-Medical):   Physical Activity:   . Days of Exercise per Week:   . Minutes of Exercise per Session:   Stress:   . Feeling of Stress :     Social Connections:   . Frequency of Communication with Friends and Family:   . Frequency of Social Gatherings with Friends and Family:   . Attends Religious Services:   . Active Member of Clubs or Organizations:   . Attends Banker Meetings:   Marland Kitchen Marital Status:    History reviewed. No pertinent family history. Past Surgical History:  Procedure Laterality Date  . HERNIA REPAIR    . WEIL OSTEOTOMY Left 09/27/2016   Procedure: Opening Wedge Osteotomy Base of the 1st Metatarsal Left Foot, Ostectomy of the Metatarsal Head with Aiken Osteotomy of the Proximal Phalanx Left Great Toe, and Weil Osteotomy 2nd and 3rd Metatarsal;  Surgeon: Nadara Mustard, MD;  Location: [facility];  Service: Orthopedics;  Laterality: Left;      Mardella Layman, MD 02/25/20 1336

## 2020-02-25 NOTE — ED Triage Notes (Signed)
Wakes in the morning with pain in right wrist.  Provider at bedside

## 2020-04-25 ENCOUNTER — Other Ambulatory Visit: Payer: Self-pay

## 2020-04-25 ENCOUNTER — Ambulatory Visit (HOSPITAL_COMMUNITY)
Admission: EM | Admit: 2020-04-25 | Discharge: 2020-04-25 | Disposition: A | Discharge status: Home / Self Care | Payer: Self-pay | Attending: Family Medicine | Admitting: Family Medicine

## 2020-04-25 ENCOUNTER — Encounter (HOSPITAL_COMMUNITY): Payer: Self-pay | Admitting: Emergency Medicine

## 2020-04-25 DIAGNOSIS — L0211 Cutaneous abscess of neck: Secondary | ICD-10-CM

## 2020-04-25 MED ORDER — CEPHALEXIN 500 MG PO CAPS: 500.0000 mg | ORAL_CAPSULE | Freq: Two times a day (BID) | ORAL | 0 refills | Status: DC

## 2020-04-25 NOTE — ED Triage Notes (Signed)
Patent has a knot at base of anterior neck.  Patient states this occurred over night.  No pain

## 2020-04-25 NOTE — Discharge Instructions (Signed)
Apply warm compresses to the infected area at least twice a day  Scar tissue on chest is called keloid

## 2020-04-25 NOTE — ED Provider Notes (Signed)
[facility]    CSN: [id number] Arrival date & time: 04/25/20  1011      History   Chief Complaint Chief Complaint  Patient presents with  . Cyst    HPI Walter Salm. is a 59 y.o. male.   Patient awoke this morning with cyst at the base of his neck anteriorly.  He thinks this might be a bite.  It is open and draining. Also has keloid over sternum.  Says he may have had surgery as a infant but he is not sure.  HPI  Past Medical History:  Diagnosis Date  . Bronchitis     Patient Active Problem List   Diagnosis Date Noted  . Bunion of great toe   . Claw toe, acquired, left     Past Surgical History:  Procedure Laterality Date  . HERNIA REPAIR    . WEIL OSTEOTOMY Left 09/27/2016   Procedure: Opening Wedge Osteotomy Base of the 1st Metatarsal Left Foot, Ostectomy of the Metatarsal Head with Aiken Osteotomy of the Proximal Phalanx Left Great Toe, and Weil Osteotomy 2nd and 3rd Metatarsal;  Surgeon: Nadara Mustard, MD;  Location: [facility];  Service: Orthopedics;  Laterality: Left;       Home Medications    Prior to Admission medications   Medication Sig Start Date End Date Taking? Authorizing Provider  cyclobenzaprine (FLEXERIL) 10 MG tablet Take 1/2 to 1 whole tablet by mouth every 8 hours as needed for muscle spasms/pain. 04/14/18   Sudie Grumbling, NP  naproxen (NAPROSYN) 500 MG tablet Take 1 tablet (500 mg total) by mouth 2 (two) times daily as needed for moderate pain. 04/14/18   Sudie Grumbling, NP  predniSONE (STERAPRED UNI-PAK 21 TAB) 10 MG (21) TBPK tablet Take by mouth daily. Take as directed. 02/25/20   Mardella Layman, MD    Family History History reviewed. No pertinent family history.  Social History Social History   Tobacco Use  . Smoking status: Current Every Day Smoker    Packs/day: 0.50  . Smokeless tobacco: Never Used  Vaping Use  . Vaping Use: Never used  Substance Use Topics  . Alcohol use: No  . Drug use: No      Allergies   No known allergies   Review of Systems Review of Systems  Skin: Positive for wound.       Anterior neck  All other systems reviewed and are negative.    Physical Exam Triage Vital Signs ED Triage Vitals  Enc Vitals Group     BP 04/25/20 1115 121/76     Pulse Rate 04/25/20 1115 76     Resp 04/25/20 1115 18     Temp 04/25/20 1115 98 F (36.7 C)     Temp Source 04/25/20 1115 Oral     SpO2 04/25/20 1115 100 %     Weight --      Height --      Head Circumference --      Peak Flow --      Pain Score 04/25/20 1113 0     Pain Loc --      Pain Edu? --      Excl. in GC? --    No data found.  Updated Vital Signs BP 121/76 (BP Location: Right Arm)   Pulse 76   Temp 98 F (36.7 C) (Oral)   Resp 18   SpO2 100%   Visual Acuity Right Eye Distance:   Left Eye Distance:  Bilateral Distance:    Right Eye Near:   Left Eye Near:    Bilateral Near:     Physical Exam Vitals and nursing note reviewed.  Constitutional:      Appearance: Normal appearance.  Skin:    Comments: Patient has a small fluctuant cyst at the base of his neck anteriorly.  Unsure whether this was a sebaceous cyst or bite.  It is however open and draining.  X Explained options could open it up and facilitate quicker drainage but also since it is already open we could have him apply warm compresses in a oral antibiotic for a brief period which should suffice  Patient also has a keloid on chest with a horizontal orientation over the sternum.  He is concerned that this may cause problems  Neurological:     Mental Status: He is alert.      UC Treatments / Results  Labs (all labs ordered are listed, but only abnormal results are displayed) Labs Reviewed - No data to display  EKG   Radiology No results found.  Procedures Procedures (including critical care time)  Medications Ordered in UC Medications - No data to display  Initial Impression / Assessment and Plan / UC Course   I have reviewed the triage vital signs and the nursing notes.  Pertinent labs & imaging results that were available during my care of the patient were reviewed by me and considered in my medical decision making (see chart for details).     Soft tissue infection on neck Keloid anterior chest Final Clinical Impressions(s) / UC Diagnoses   Final diagnoses:  None   Discharge Instructions   None    ED Prescriptions    None     PDMP not reviewed this encounter.   Frederica Kuster, MD 04/25/20 1144

## 2021-06-15 ENCOUNTER — Ambulatory Visit: Payer: Self-pay | Admitting: Podiatry

## 2021-10-20 ENCOUNTER — Ambulatory Visit (HOSPITAL_COMMUNITY)
Admission: EM | Admit: 2021-10-20 | Discharge: 2021-10-20 | Disposition: A | Discharge status: Home / Self Care | Payer: Self-pay | Attending: Family Medicine | Admitting: Family Medicine

## 2021-10-20 ENCOUNTER — Encounter (HOSPITAL_COMMUNITY): Payer: Self-pay | Admitting: Emergency Medicine

## 2021-10-20 ENCOUNTER — Other Ambulatory Visit: Payer: Self-pay

## 2021-10-20 DIAGNOSIS — L03211 Cellulitis of face: Secondary | ICD-10-CM

## 2021-10-20 MED ORDER — AMOXICILLIN-POT CLAVULANATE 875-125 MG PO TABS: 1.0000 | ORAL_TABLET | Freq: Two times a day (BID) | ORAL | 0 refills | Status: AC

## 2021-10-20 NOTE — ED Triage Notes (Signed)
Pt reports right cheek swollen that started yesterday. Denies pains or injuries.  ?

## 2021-10-20 NOTE — ED Provider Notes (Signed)
?[place] ? ? ? ?CSN: [id number] ?Arrival date & time: 10/20/21  [id number] ? ? ?  ? ?History   ?Chief Complaint ?Chief Complaint  ?Patient presents with  ? Facial Swelling  ? ? ?HPI ?Walter Adams. is a 61 y.o. male.  ? ?HPI ?Here with swelling in his right cheek near his nose that began yesterday.  It is not really painful nor has he had fever or chills.  No upper respiratory symptoms. ? ?He does have some occasional trouble chewing. ? ?Past Medical History:  ?Diagnosis Date  ? Bronchitis   ? ? ?Patient Active Problem List  ? Diagnosis Date Noted  ? Bunion of great toe   ? Claw toe, acquired, left   ? ? ?Past Surgical History:  ?Procedure Laterality Date  ? HERNIA REPAIR    ? WEIL OSTEOTOMY Left 09/27/2016  ? Procedure: Opening Wedge Osteotomy Base of the 1st Metatarsal Left Foot, Ostectomy of the Metatarsal Head with Aiken Osteotomy of the Proximal Phalanx Left Great Toe, and Weil Osteotomy 2nd and 3rd Metatarsal;  Surgeon: Newt Minion, MD;  Location: [facility];  Service: Orthopedics;  Laterality: Left;  ? ? ? ? ? ?Home Medications   ? ?Prior to Admission medications   ?Medication Sig Start Date End Date Taking? Authorizing Provider  ?amoxicillin-clavulanate (AUGMENTIN) 875-125 MG tablet Take 1 tablet by mouth 2 (two) times daily for 7 days. 10/20/21 10/27/21 Yes Barrett Henle, MD  ? ? ?Family History ?No family history on file. ? ?Social History ?Social History  ? ?Tobacco Use  ? Smoking status: Every Day  ?  Packs/day: 0.50  ?  Types: Cigarettes  ? Smokeless tobacco: Never  ?Vaping Use  ? Vaping Use: Never used  ?Substance Use Topics  ? Alcohol use: No  ? Drug use: No  ? ? ? ?Allergies   ?No known allergies ? ? ?Review of Systems ?Review of Systems ? ? ?Physical Exam ?Triage Vital Signs ?ED Triage Vitals  ?Enc Vitals Group  ?   BP 10/20/21 0911 111/77  ?   Pulse Rate 10/20/21 0911 70  ?   Resp 10/20/21 0911 17  ?   Temp 10/20/21 0911 97.9 ?F (36.6 ?C)  ?   Temp Source 10/20/21 0911 Oral  ?    SpO2 10/20/21 0911 100 %  ?   Weight --   ?   Height --   ?   Head Circumference --   ?   Peak Flow --   ?   Pain Score 10/20/21 0910 0  ?   Pain Loc --   ?   Pain Edu? --   ?   Excl. in [place]? --   ? ?No data found. ? ?Updated Vital Signs ?BP 111/77 (BP Location: Left Arm)   Pulse 70   Temp 97.9 ?F (36.6 ?C) (Oral)   Resp 17   SpO2 100%  ? ?Visual Acuity ?Right Eye Distance:   ?Left Eye Distance:   ?Bilateral Distance:   ? ?Right Eye Near:   ?Left Eye Near:    ?Bilateral Near:    ? ?Physical Exam ?Vitals reviewed.  ?Constitutional:   ?   General: He is not in acute distress. ?   Appearance: He is not toxic-appearing.  ?HENT:  ?   Head:  ?   Comments: There is induration and swelling and mild tenderness of the right cheek in the area approximately 3 x 4 cm.  It is adjacent to his  nose, but does not include the nose ?   Nose: Nose normal.  ?   Mouth/Throat:  ?   Mouth: Mucous membranes are moist.  ?   Pharynx: No oropharyngeal exudate or posterior oropharyngeal erythema.  ?   Comments: There are broken teeth on the right for dental ridge.  No edema in the mouth. ?Cardiovascular:  ?   Rate and Rhythm: Normal rate and regular rhythm.  ?   Heart sounds: No murmur heard. ?Pulmonary:  ?   Effort: Pulmonary effort is normal.  ?   Breath sounds: Normal breath sounds.  ?Skin: ?   Coloration: Skin is not jaundiced or pale.  ?Neurological:  ?   General: No focal deficit present.  ?   Mental Status: He is alert and oriented to person, place, and time.  ?Psychiatric:     ?   Behavior: Behavior normal.  ? ? ? ?UC Treatments / Results  ?Labs ?(all labs ordered are listed, but only abnormal results are displayed) ?Labs Reviewed - No data to display ? ?EKG ? ? ?Radiology ?No results found. ? ?Procedures ?Procedures (including critical care time) ? ?Medications Ordered in UC ?Medications - No data to display ? ?Initial Impression / Assessment and Plan / UC Course  ?I have reviewed the triage vital signs and the nursing  notes. ? ?Pertinent labs & imaging results that were available during my care of the patient were reviewed by me and considered in my medical decision making (see chart for details). ? ?  ? ?We will treat with Augmentin for probable cellulitis.  He is given a list of low-cost dental care options in the area.  Assistance is requested to help him find a PCP.  He will take over-the-counter ibuprofen if he needs something for pain. ?Final Clinical Impressions(s) / UC Diagnoses  ? ?Final diagnoses:  ?Facial cellulitis  ? ? ? ?Discharge Instructions   ? ?  ?Take amoxicillin-clavulanate 875 mg--1 tab twice daily with food for 7 days ? ?If you need it, you can take ibuprofen 200 mg-3 to 4 tablets every 8 hours as needed for pain ? ?You can use a warm compress to the affected area ? ? ? ? ? ? ?ED Prescriptions   ? ? Medication Sig Dispense Auth. Provider  ? amoxicillin-clavulanate (AUGMENTIN) 875-125 MG tablet Take 1 tablet by mouth 2 (two) times daily for 7 days. 14 tablet Freeman Borba, Gwenlyn Perking, MD  ? ?  ? ?PDMP not reviewed this encounter. ?  ?Barrett Henle, MD ?10/20/21 [phone] ? ?

## 2021-10-20 NOTE — Discharge Instructions (Addendum)
Take amoxicillin-clavulanate 875 mg--1 tab twice daily with food for 7 days ? ?If you need it, you can take ibuprofen 200 mg-3 to 4 tablets every 8 hours as needed for pain ? ?You can use a warm compress to the affected area ? ? ?

## 2022-12-25 ENCOUNTER — Encounter (HOSPITAL_COMMUNITY): Payer: Self-pay

## 2022-12-25 ENCOUNTER — Ambulatory Visit (HOSPITAL_COMMUNITY)
Admission: EM | Admit: 2022-12-25 | Discharge: 2022-12-25 | Disposition: A | Discharge status: Home / Self Care | Payer: Self-pay | Attending: Family Medicine | Admitting: Family Medicine

## 2022-12-25 DIAGNOSIS — M79605 Pain in left leg: Secondary | ICD-10-CM | POA: Insufficient documentation

## 2022-12-25 DIAGNOSIS — M79604 Pain in right leg: Secondary | ICD-10-CM | POA: Insufficient documentation

## 2022-12-25 LAB — BASIC METABOLIC PANEL
Anion gap: 8 (ref 5–15)
BUN: 7 mg/dL (ref 6–20)
CO2: 25 mmol/L (ref 22–32)
Calcium: 9 mg/dL (ref 8.9–10.3)
Chloride: 103 mmol/L (ref 98–111)
Creatinine, Ser: 1.04 mg/dL (ref 0.61–1.24)
GFR, Estimated: >60 mL/min (ref >60)
Glucose, Bld: 75 mg/dL (ref 70–99)
Potassium: 4.5 mmol/L (ref 3.5–5.1)
Sodium: 136 mmol/L (ref 135–145)

## 2022-12-25 LAB — CBC
HCT: 45.8 % (ref 39.0–52.0)
Hemoglobin: 15.1 g/dL (ref 13.0–17.0)
MCH: 32.3 pg (ref 26.0–34.0)
MCHC: 33 g/dL (ref 30.0–36.0)
MCV: 97.9 fL (ref 80.0–100.0)
Platelets: 293 10*3/uL (ref 150–400)
RBC: 4.68 MIL/uL (ref 4.22–5.81)
RDW: 13.3 % (ref 11.5–15.5)
WBC: 5.6 10*3/uL (ref 4.0–10.5)
nRBC: 0 % (ref 0.0–0.2)

## 2022-12-25 MED ORDER — TIZANIDINE HCL 4 MG PO TABS: 4.0000 mg | ORAL_TABLET | Freq: Three times a day (TID) | ORAL | 0 refills | Status: DC | PRN

## 2022-12-25 MED ORDER — PREDNISONE 20 MG PO TABS: 40.0000 mg | ORAL_TABLET | Freq: Every day | ORAL | 0 refills | Status: AC

## 2022-12-25 NOTE — ED Provider Notes (Signed)
[facility]    CSN: [id number] Arrival date & time: 12/25/22  1314      History   Chief Complaint Chief Complaint  Patient presents with   Leg Pain    HPI Walter Adams. is a 62 y.o. male.    Leg Pain  Here for pains in both legs.  They occur with certain motions and are not constant.  No swelling in the legs or feet No fever or rash No recent nausea vomiting or diarrhea.  He states he has been drinking plenty of water p He does not take any medications for any chronic conditions.  Sometimes this pain is in the right lateral thigh and sometimes it is in the right medial thigh or in the left medial thigh.   Past Medical History:  Diagnosis Date   Bronchitis     Patient Active Problem List   Diagnosis Date Noted   Bunion of great toe    Claw toe, acquired, left     Past Surgical History:  Procedure Laterality Date   HERNIA REPAIR     WEIL OSTEOTOMY Left 09/27/2016   Procedure: Opening Wedge Osteotomy Base of the 1st Metatarsal Left Foot, Ostectomy of the Metatarsal Head with Aiken Osteotomy of the Proximal Phalanx Left Great Toe, and Weil Osteotomy 2nd and 3rd Metatarsal;  Surgeon: Nadara Mustard, MD;  Location: [facility];  Service: Orthopedics;  Laterality: Left;       Home Medications    Prior to Admission medications   Medication Sig Start Date End Date Taking? Authorizing Provider  predniSONE (DELTASONE) 20 MG tablet Take 2 tablets (40 mg total) by mouth daily with breakfast for 5 days. 12/25/22 12/30/22 Yes Mylo Choi, Janace Aris, MD  tiZANidine (ZANAFLEX) 4 MG tablet Take 1 tablet (4 mg total) by mouth every 8 (eight) hours as needed for muscle spasms. 12/25/22  Yes Zenia Resides, MD    Family History History reviewed. No pertinent family history.  Social History Social History   Tobacco Use   Smoking status: Every Day    Packs/day: .5    Types: Cigarettes   Smokeless tobacco: Never  Vaping Use   Vaping Use: Never used   Substance Use Topics   Alcohol use: No   Drug use: No     Allergies   No known allergies   Review of Systems Review of Systems   Physical Exam Triage Vital Signs ED Triage Vitals [12/25/22 1354]  Enc Vitals Group     BP (!) 132/92     Pulse Rate 86     Resp 18     Temp 97.6 F (36.4 C)     Temp Source Oral     SpO2 95 %     Weight      Height      Head Circumference      Peak Flow      Pain Score      Pain Loc      Pain Edu?      Excl. in [place]?    No data found.  Updated Vital Signs BP (!) 132/92 (BP Location: Left Arm)   Pulse 86   Temp 97.6 F (36.4 C) (Oral)   Resp 18   SpO2 95%   Visual Acuity Right Eye Distance:   Left Eye Distance:   Bilateral Distance:    Right Eye Near:   Left Eye Near:    Bilateral Near:     Physical Exam Vitals  reviewed.  Constitutional:      General: He is not in acute distress.    Appearance: He is not ill-appearing, toxic-appearing or diaphoretic.  HENT:     Mouth/Throat:     Mouth: Mucous membranes are moist.  Eyes:     Extraocular Movements: Extraocular movements intact.     Conjunctiva/sclera: Conjunctivae normal.     Pupils: Pupils are equal, round, and reactive to light.  Cardiovascular:     Rate and Rhythm: Normal rate and regular rhythm.     Heart sounds: No murmur heard. Pulmonary:     Effort: Pulmonary effort is normal.     Breath sounds: Normal breath sounds.  Musculoskeletal:     Cervical back: Neck supple.     Comments: There is some mild tenderness of the medial thigh musculature near the knee.  There is no edema.  There is no rash or erythema.  Lymphadenopathy:     Cervical: No cervical adenopathy.  Skin:    Coloration: Skin is not jaundiced or pale.  Neurological:     Mental Status: He is alert and oriented to person, place, and time.  Psychiatric:        Behavior: Behavior normal.      UC Treatments / Results  Labs (all labs ordered are listed, but only abnormal results are  displayed) Labs Reviewed  CBC  BASIC METABOLIC PANEL    EKG   Radiology No results found.  Procedures Procedures (including critical care time)  Medications Ordered in UC Medications - No data to display  Initial Impression / Assessment and Plan / UC Course  I have reviewed the triage vital signs and the nursing notes.  Pertinent labs & imaging results that were available during my care of the patient were reviewed by me and considered in my medical decision making (see chart for details).     CBC and BMP are drawn to assess electrolytes and blood counts.  Will notify him of anything significantly abnormal.  Tizanidine is sent in as a muscle relaxer and 5 days of prednisone are sent in. Final Clinical Impressions(s) / UC Diagnoses   Final diagnoses:  Bilateral leg pain     Discharge Instructions      We have drawn blood to check your blood counts and your sodium and potassium and kidney function.  Staff will notify you if there is anything significantly abnormal and needs treatment  Take prednisone 20 mg--2 daily for 5 days   Take tizanidine 4 mg--1 every 8 hours as needed for muscle spasms; this medication can cause dizziness and sleepiness       ED Prescriptions     Medication Sig Dispense Auth. Provider   predniSONE (DELTASONE) 20 MG tablet Take 2 tablets (40 mg total) by mouth daily with breakfast for 5 days. 10 tablet Zenia Resides, MD   tiZANidine (ZANAFLEX) 4 MG tablet Take 1 tablet (4 mg total) by mouth every 8 (eight) hours as needed for muscle spasms. 15 tablet Solyana Nonaka, Janace Aris, MD      PDMP not reviewed this encounter.   Zenia Resides, MD 12/25/22 [phone]

## 2022-12-25 NOTE — Discharge Instructions (Signed)
We have drawn blood to check your blood counts and your sodium and potassium and kidney function.  Staff will notify you if there is anything significantly abnormal and needs treatment  Take prednisone 20 mg--2 daily for 5 days   Take tizanidine 4 mg--1 every 8 hours as needed for muscle spasms; this medication can cause dizziness and sleepiness

## 2022-12-25 NOTE — ED Triage Notes (Addendum)
Onset; Today Left sided lower leg pain that started this morning. Pt reports his right is hurting. Pt reports his leg feels tight. Denies any falls or injuries at this time.

## 2023-09-26 ENCOUNTER — Other Ambulatory Visit: Payer: Self-pay

## 2023-09-26 ENCOUNTER — Ambulatory Visit
Admission: EM | Admit: 2023-09-26 | Discharge: 2023-09-26 | Disposition: A | Discharge status: Home / Self Care | Payer: Self-pay | Attending: Family | Admitting: Family

## 2023-09-26 ENCOUNTER — Encounter: Payer: Self-pay | Admitting: *Deleted

## 2023-09-26 DIAGNOSIS — R051 Acute cough: Secondary | ICD-10-CM

## 2023-09-26 DIAGNOSIS — U071 COVID-19: Secondary | ICD-10-CM

## 2023-09-26 LAB — POC COVID19/FLU A&B COMBO
Covid Antigen, POC: POSITIVE — AB
Influenza A Antigen, POC: NEGATIVE
Influenza B Antigen, POC: NEGATIVE

## 2023-09-26 MED ORDER — PAXLOVID (300/100) 20 X 150 MG & 10 X 100MG PO TBPK: 3.0000 | ORAL_TABLET | Freq: Two times a day (BID) | ORAL | 0 refills | Status: AC

## 2023-09-26 NOTE — ED Triage Notes (Signed)
 Cough, runny nose chills, sneezing onset this morning. No sick contacts. No meds today. Pt requesting covid/flu tests.

## 2023-09-26 NOTE — ED Provider Notes (Signed)
 [facility]    CSN: [id number] Arrival date & time: 09/26/23  1713      History   Chief Complaint Chief Complaint  Patient presents with   Cough    HPI Walter Adams. is a 63 y.o. male.   63 year old male presents with cough, runny nose, sneezing and body aches that started this morning.  Denies any fever, sore throat, chills or GI symptoms.  Has not taken any medication for symptoms yet.  No known exposure to COVID or influenza.  Not previously vaccinated for COVID.  Lives with wife who is immunocompromised.  No other chronic health issues.  Takes no daily medication.  The history is provided by the patient.    Past Medical History:  Diagnosis Date   Bronchitis     Patient Active Problem List   Diagnosis Date Noted   Bunion of great toe    Claw toe, acquired, left     Past Surgical History:  Procedure Laterality Date   HERNIA REPAIR     WEIL OSTEOTOMY Left 09/27/2016   Procedure: Opening Wedge Osteotomy Base of the 1st Metatarsal Left Foot, Ostectomy of the Metatarsal Head with Aiken Osteotomy of the Proximal Phalanx Left Great Toe, and Weil Osteotomy 2nd and 3rd Metatarsal;  Surgeon: Nadara Mustard, MD;  Location: [facility];  Service: Orthopedics;  Laterality: Left;       Home Medications    Prior to Admission medications   Medication Sig Start Date End Date Taking? Authorizing Provider  nirmatrelvir/ritonavir (PAXLOVID, 300/100,) 20 x 150 MG & 10 x 100MG  TBPK Take 3 tablets by mouth 2 (two) times daily for 5 days. Patient GFR is >60. Take nirmatrelvir (150 mg) two tablets twice daily for 5 days and ritonavir (100 mg) one tablet twice daily for 5 days. 09/26/23 10/01/23 Yes Xzaiver Vayda, Ali Lowe, NP  predniSONE (DELTASONE) 20 MG tablet Take 1 tablet (20 mg total) by mouth daily with breakfast for 5 days. 09/27/23 10/02/23  Bing Neighbors, NP  promethazine-dextromethorphan (PROMETHAZINE-DM) 6.25-15 MG/5ML syrup Take 5 mLs by mouth 4 (four) times daily as  needed for cough. 09/27/23   Bing Neighbors, NP    Family History History reviewed. No pertinent family history.  Social History Social History   Tobacco Use   Smoking status: Former    Current packs/day: 0.50    Types: Cigarettes   Smokeless tobacco: Never  Vaping Use   Vaping status: Every Day  Substance Use Topics   Alcohol use: No   Drug use: No     Allergies   No known allergies   Review of Systems Review of Systems  Constitutional:  Negative for activity change, appetite change, chills, diaphoresis and fever.  HENT:  Positive for congestion, postnasal drip and rhinorrhea. Negative for ear discharge, ear pain, sinus pressure, sinus pain, sore throat and trouble swallowing.   Eyes:  Negative for discharge, redness and itching.  Respiratory:  Positive for cough. Negative for chest tightness, shortness of breath and wheezing.   Cardiovascular:  Negative for chest pain.  Gastrointestinal:  Negative for abdominal pain, nausea and vomiting.  Musculoskeletal:  Positive for myalgias. Negative for arthralgias, neck pain and neck stiffness.  Skin:  Negative for color change and rash.  Allergic/Immunologic: Negative for environmental allergies, food allergies and immunocompromised state.  Neurological:  Negative for dizziness, tremors, seizures, syncope, weakness, light-headedness, numbness and headaches.  Hematological:  Negative for adenopathy. Does not bruise/bleed easily.  Physical Exam Triage Vital Signs ED Triage Vitals  Encounter Vitals Group     BP 09/26/23 1752 137/86     Systolic BP Percentile --      Diastolic BP Percentile --      Pulse Rate 09/26/23 1752 86     Resp 09/26/23 1752 16     Temp 09/26/23 1752 98.4 F (36.9 C)     Temp Source 09/26/23 1752 Oral     SpO2 09/26/23 1752 94 %     Weight --      Height --      Head Circumference --      Peak Flow --      Pain Score 09/26/23 1750 0     Pain Loc --      Pain Education --      Exclude  from Growth Chart --    No data found.  Updated Vital Signs BP 137/86 (BP Location: Right Arm)   Pulse 86   Temp 98.4 F (36.9 C) (Oral)   Resp 16   SpO2 94%   Visual Acuity Right Eye Distance:   Left Eye Distance:   Bilateral Distance:    Right Eye Near:   Left Eye Near:    Bilateral Near:     Physical Exam Vitals and nursing note reviewed.  Constitutional:      General: He is awake. He is not in acute distress.    Appearance: He is well-developed and well-groomed. He is not ill-appearing.     Comments: He is sitting comfortably on the exam chair in no acute distress.   HENT:     Head: Normocephalic and atraumatic.     Right Ear: Hearing, tympanic membrane, ear canal and external ear normal.     Left Ear: Hearing, tympanic membrane, ear canal and external ear normal.     Nose: Congestion present.     Right Sinus: No maxillary sinus tenderness or frontal sinus tenderness.     Left Sinus: No maxillary sinus tenderness or frontal sinus tenderness.     Mouth/Throat:     Lips: Pink.     Mouth: Mucous membranes are moist.     Pharynx: Oropharynx is clear. Uvula midline. No pharyngeal swelling, oropharyngeal exudate, posterior oropharyngeal erythema, uvula swelling or postnasal drip.  Eyes:     Conjunctiva/sclera: Conjunctivae normal.  Cardiovascular:     Rate and Rhythm: Normal rate and regular rhythm.     Heart sounds: Normal heart sounds. No murmur heard. Pulmonary:     Effort: Pulmonary effort is normal. No tachypnea, respiratory distress or retractions.     Breath sounds: Normal breath sounds and air entry. No decreased air movement. No decreased breath sounds, wheezing, rhonchi or rales.  Musculoskeletal:     Cervical back: Normal range of motion and neck supple.  Lymphadenopathy:     Cervical: No cervical adenopathy.  Skin:    General: Skin is warm and dry.     Capillary Refill: Capillary refill takes less than 2 seconds.     Findings: No rash.  Neurological:      General: No focal deficit present.     Mental Status: He is alert and oriented to person, place, and time.  Psychiatric:        Attention and Perception: Attention normal.        Mood and Affect: Mood normal.        Speech: Speech normal.        Behavior: Behavior normal. Behavior  is cooperative.        Thought Content: Thought content normal.        Cognition and Memory: Cognition normal.      UC Treatments / Results  Labs (all labs ordered are listed, but only abnormal results are displayed) Labs Reviewed  POC COVID19/FLU A&B COMBO - Abnormal; Notable for the following components:      Result Value   Covid Antigen, POC Positive (*)    All other components within normal limits    EKG   Radiology No results found.  Procedures Procedures (including critical care time)  Medications Ordered in UC Medications - No data to display  Initial Impression / Assessment and Plan / UC Course  I have reviewed the triage vital signs and the nursing notes.  Pertinent labs & imaging results that were available during my care of the patient were reviewed by me and considered in my medical decision making (see chart for details).     Reviewed with patient positive COVID test results and negative Influenza. Patient in disbelief. Insisting he has not had any contact with anyone with COVID. He did indicate other employees have been "sick" at work last week with flu. Reviewed again with patient methods of acquiring COVID infection. Discussed that due to his age, he would qualify for antiviral treatment - specifically Paxlovid. Reviewed benefits, side effects but patient refused. Reviewed contagious nature of COVID and ways to help prevent spread to wife and daughter. Continue to push fluids to help loosen up mucus in sinuses. May take OTC cough and cold medication such as Dayquil and Nyquil as directed as needed. Note written for work. If any difficulty breathing or shortness of breath occur,  return to Urgent Care or go to the ER ASAP for further evaluation. Recommend follow-up here in 3 to 4 days if minimal improvement or sooner if worsening.    Addendum: Patient talked with a family member at discharge and insisted we did not discuss Paxlovid or antiviral treatment. Reviewed with patient that we did discuss Paxlovid and would be more than happy to prescribe this medication for him. Patient then decided to take Paxlovid and Rx was sent to his pharmacy of choice. Follow-up as described above.  Final Clinical Impressions(s) / UC Diagnoses   Final diagnoses:  COVID-19 virus infection  Acute cough     Discharge Instructions      Recommend continue to push fluids to help loosen up mucus in sinuses. Rest. May take OTC cold/cough medication as directed as needed for symptoms. Follow-up here in 3 to 4 days if minimal improvement or sooner if worsening.     ED Prescriptions     Medication Sig Dispense Auth. Provider   nirmatrelvir/ritonavir (PAXLOVID, 300/100,) 20 x 150 MG & 10 x 100MG  TBPK Take 3 tablets by mouth 2 (two) times daily for 5 days. Patient GFR is >60. Take nirmatrelvir (150 mg) two tablets twice daily for 5 days and ritonavir (100 mg) one tablet twice daily for 5 days. 30 tablet Tiffanyann Deroo, Ali Lowe, NP      PDMP not reviewed this encounter.   Sudie Grumbling, NP 09/27/23 1250

## 2023-09-26 NOTE — Discharge Instructions (Signed)
 Recommend continue to push fluids to help loosen up mucus in sinuses. Rest. May take OTC cold/cough medication as directed as needed for symptoms. Follow-up here in 3 to 4 days if minimal improvement or sooner if worsening.

## 2023-09-27 ENCOUNTER — Encounter: Payer: Self-pay | Admitting: Family Medicine

## 2023-09-27 ENCOUNTER — Telehealth: Payer: Self-pay | Admitting: Family Medicine

## 2023-09-27 DIAGNOSIS — U071 COVID-19: Secondary | ICD-10-CM

## 2023-09-27 MED ORDER — PREDNISONE 20 MG PO TABS: 20.0000 mg | ORAL_TABLET | Freq: Every day | ORAL | 0 refills | Status: AC

## 2023-09-27 MED ORDER — PROMETHAZINE-DM 6.25-15 MG/5ML PO SYRP: 5.0000 mL | ORAL_SOLUTION | Freq: Four times a day (QID) | ORAL | 0 refills | Status: AC | PRN

## 2023-09-27 NOTE — Telephone Encounter (Signed)
 Call to pt. Advised of medication sent to pharmacy and reviewed each med. Reviewed work note. Pt agreeable and denies further needs at this time.

## 2023-09-27 NOTE — Telephone Encounter (Signed)
 Patient is wife called today to advise the patient symptoms have not improved.  They were unable to pick up Paxlovid as it was too expensive at the pharmacy as he does not have medical insurance.  This Clinical research associate reviewed chart and E prescribe prednisone and Promethazine DM.  Advised nurse to educate patient and his wife with COVID symptoms can linger for 7 to 10 days however he is able to return to normal activity as long as he is fever free if he is experiencing any shortness of breath or chest pain he is to go immediately to the emergency department.
